# Patient Record
Sex: Male | Born: 1935 | Race: White | Hispanic: No | Marital: Married | Smoking: Former smoker
Health system: Southern US, Community
[De-identification: ages and names within clinical notes are randomized; demographics above are authoritative.]

## PROBLEM LIST (undated history)

## (undated) DIAGNOSIS — M1990 Unspecified osteoarthritis, unspecified site: Secondary | ICD-10-CM

## (undated) DIAGNOSIS — K219 Gastro-esophageal reflux disease without esophagitis: Secondary | ICD-10-CM

## (undated) DIAGNOSIS — C259 Malignant neoplasm of pancreas, unspecified: Secondary | ICD-10-CM

## (undated) DIAGNOSIS — N400 Enlarged prostate without lower urinary tract symptoms: Secondary | ICD-10-CM

## (undated) HISTORY — DX: Gastro-esophageal reflux disease without esophagitis: K21.9

## (undated) HISTORY — PX: HERNIA REPAIR: SHX51

## (undated) HISTORY — PX: TONSILLECTOMY: SUR1361

## (undated) HISTORY — DX: Malignant neoplasm of pancreas, unspecified: C25.9

---

## 2002-12-20 ENCOUNTER — Encounter: Payer: Self-pay | Admitting: Family Medicine

## 2002-12-20 ENCOUNTER — Ambulatory Visit (HOSPITAL_COMMUNITY): Admission: RE | Admit: 2002-12-20 | Discharge: 2002-12-20 | Payer: Self-pay | Admitting: Family Medicine

## 2002-12-31 ENCOUNTER — Encounter: Payer: Self-pay | Admitting: Family Medicine

## 2002-12-31 ENCOUNTER — Ambulatory Visit (HOSPITAL_COMMUNITY): Admission: RE | Admit: 2002-12-31 | Discharge: 2002-12-31 | Payer: Self-pay | Admitting: Family Medicine

## 2003-01-08 ENCOUNTER — Ambulatory Visit (HOSPITAL_COMMUNITY): Admission: RE | Admit: 2003-01-08 | Discharge: 2003-01-08 | Payer: Self-pay | Admitting: Pulmonary Disease

## 2008-04-25 ENCOUNTER — Ambulatory Visit (HOSPITAL_COMMUNITY): Admission: RE | Admit: 2008-04-25 | Discharge: 2008-04-25 | Payer: Self-pay | Admitting: Family Medicine

## 2010-08-07 NOTE — Procedures (Signed)
   NAME:  Jeremy Ray, Jeremy Ray                      ACCOUNT NO.:  [id number]   MEDICAL RECORD NO.:  [id number]                   PATIENT TYPE:  OUT   LOCATION:  RAD                                  FACILITY:  [facility]   PHYSICIAN:  Edward L. Juanetta Gosling, M.D.             DATE OF BIRTH:  05-02-1923   DATE OF PROCEDURE:  01/08/2003  DATE OF DISCHARGE:                                    STRESS TEST   INDICATIONS FOR PROCEDURE:  [name] is undergoing graded exercise  testing because of a history of slow heart rate.  He has had some episodes  of being faint when he stood up.  There are no contraindications to graded  exercise testing.   [name] exercised for six minutes on the Bruce protocol, reaching and  sustaining 7.0 METs.  His maximum recorded heart rate was 169.  His blood  pressure response to exercise was normal.  He had no chest discomfort during  exercise but almost immediately after exercise started he began having  abnormal heart rate with bursts of what appeared to be atrial fibrillation,  bursts of wandering atrial pacemaker, and multiple PACs.  He was  asymptomatic during these.  He also had some PVCs and had PVCs during  recovery as well.  As mentioned, he had no symptoms during this.  His  maximum sustained heart rate with this was about 169-170.  There were no  electrocardiographic changes suggestive of inducible ischemia and his blood  pressure response to exercise was normal.   IMPRESSION:  1. No evidence of inducible ischemia.  2. No symptoms with exercise.  3. Normal blood pressure response to exercise.  4. Atrial arrhythmias including atrial flutter/fibrillation and wandering     atrial pacemaker with exercise.      ___________________________________________                                            Oneal Deputy. Juanetta Gosling, M.D.   ELH/MEDQ  D:  01/08/2003  T:  01/08/2003  Job:  [id number]   cc:   Angus G. Renard Matter, M.D.  [address]  [place]  [place]  [address]  Fax: [phone]

## 2011-11-09 ENCOUNTER — Ambulatory Visit (INDEPENDENT_AMBULATORY_CARE_PROVIDER_SITE_OTHER): Payer: Medicare Other | Admitting: Urology

## 2011-11-09 DIAGNOSIS — N433 Hydrocele, unspecified: Secondary | ICD-10-CM

## 2011-11-09 DIAGNOSIS — K4090 Unilateral inguinal hernia, without obstruction or gangrene, not specified as recurrent: Secondary | ICD-10-CM

## 2011-11-09 DIAGNOSIS — N529 Male erectile dysfunction, unspecified: Secondary | ICD-10-CM

## 2011-11-09 DIAGNOSIS — N400 Enlarged prostate without lower urinary tract symptoms: Secondary | ICD-10-CM

## 2011-11-09 DIAGNOSIS — R972 Elevated prostate specific antigen [PSA]: Secondary | ICD-10-CM

## 2012-09-05 ENCOUNTER — Encounter: Payer: Self-pay | Admitting: *Deleted

## 2012-09-06 ENCOUNTER — Encounter: Payer: Self-pay | Admitting: Cardiovascular Disease

## 2012-09-06 ENCOUNTER — Ambulatory Visit (INDEPENDENT_AMBULATORY_CARE_PROVIDER_SITE_OTHER): Payer: Medicare Other | Admitting: Cardiovascular Disease

## 2012-09-06 VITALS — BP 125/65 | HR 69 | Ht 70.0 in | Wt 156.0 lb

## 2012-09-06 DIAGNOSIS — R9431 Abnormal electrocardiogram [ECG] [EKG]: Secondary | ICD-10-CM

## 2012-09-06 DIAGNOSIS — I10 Essential (primary) hypertension: Secondary | ICD-10-CM

## 2012-09-06 DIAGNOSIS — I491 Atrial premature depolarization: Secondary | ICD-10-CM

## 2012-09-06 NOTE — Assessment & Plan Note (Signed)
Asymptomatic no need for Rx

## 2012-09-06 NOTE — Progress Notes (Signed)
Patient ID: Jeremy Ray, male   DOB: [birth year], 79 y.o.   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 Referred for abnormal ECG  Reviewed ECG from Dr Renard Matter and today SR PAC poor R wave progression possible anterior MI  Patient is asymptomatic and active No chest pain, palpitations dyspnea or syncope No history of heart issues.  Some arthritis and GERD.  No other old ECG; sin epic Denies excess alcohol or stimulants or coffee.    ROS: Denies fever, malais, weight loss, blurry vision, decreased visual acuity, cough, sputum, SOB, hemoptysis, pleuritic pain, palpitaitons, heartburn, abdominal pain, melena, lower extremity edema, claudication, or rash.  All other systems reviewed and negative   General: Affect appropriate Healthy:  appears stated age HEENT: normal Neck supple with no adenopathy JVP normal no bruits no thyromegaly Lungs clear with no wheezing and good diaphragmatic motion Heart:  S1/S2 no murmur,rub, gallop or click PMI normal Abdomen: benighn, BS positve, no tenderness, no AAA no bruit.  No HSM or HJR Distal pulses intact with no bruits No edema Neuro non-focal Skin warm and dry No muscular weakness  Medications Current Outpatient Prescriptions  Medication Sig Dispense Refill  . pantoprazole (PROTONIX) 40 MG tablet 40 mg as needed.       . traMADol (ULTRAM) 50 MG tablet 50 mg every 6 (six) hours as needed.        No current facility-administered medications for this visit.    Allergies Review of patient's allergies indicates no known allergies.  Family History: No family history on file.  Social History: History   Social History  . Marital Status: Married    Spouse Name: N/A    Number of Children: N/A  . Years of Education: N/A   Occupational History  . Not on file.   Social History Main Topics  . Smoking status: Never Smoker   . Smokeless tobacco: Not on file  . Alcohol Use: No  . Drug Use: No  . Sexually Active: Not on file   Other  Topics Concern  . Not on file   Social History Narrative  . No narrative on file    Electrocardiogram:  SR rate 80 poor R wave progression  Assessment and Plan

## 2012-09-06 NOTE — Assessment & Plan Note (Signed)
Doubt significant cardiac issue.  Echo to assess RWMAls .

## 2012-09-06 NOTE — Patient Instructions (Addendum)
Your physician recommends that you schedule a follow-up appointment in:AS NEEDED  Your physician has requested that you have an echocardiogram. Echocardiography is a painless test that uses sound waves to create images of your heart. It provides your doctor with information about the size and shape of your heart and how well your heart's chambers and valves are working. This procedure takes approximately one hour. There are no restrictions for this procedure WE WILL CALL YOU WITH THE RESULTS

## 2012-09-07 ENCOUNTER — Ambulatory Visit (HOSPITAL_COMMUNITY)
Admission: RE | Admit: 2012-09-07 | Discharge: 2012-09-07 | Disposition: A | Discharge status: Home / Self Care | Payer: Medicare Other | Source: Ambulatory Visit | Attending: Cardiovascular Disease | Admitting: Cardiovascular Disease

## 2012-09-07 DIAGNOSIS — R9431 Abnormal electrocardiogram [ECG] [EKG]: Secondary | ICD-10-CM

## 2012-09-07 DIAGNOSIS — I10 Essential (primary) hypertension: Secondary | ICD-10-CM | POA: Insufficient documentation

## 2012-09-07 DIAGNOSIS — I517 Cardiomegaly: Secondary | ICD-10-CM

## 2012-09-07 NOTE — Progress Notes (Signed)
*  PRELIMINARY RESULTS* Echocardiogram 2D Echocardiogram has been performed.  Jeremy Ray 09/07/2012, 11:44 AM

## 2012-09-11 ENCOUNTER — Telehealth: Payer: Self-pay | Admitting: *Deleted

## 2012-09-11 NOTE — Telephone Encounter (Signed)
Results for echo as noted:  Normal echo with good EF and no significant valvular heart disease      Pt made aware, noted error in epic allowing this nurse to place notations in pt chart on the results note

## 2013-02-27 ENCOUNTER — Other Ambulatory Visit (HOSPITAL_COMMUNITY): Payer: Self-pay | Admitting: Family Medicine

## 2013-02-27 DIAGNOSIS — R634 Abnormal weight loss: Secondary | ICD-10-CM

## 2013-02-27 DIAGNOSIS — R17 Unspecified jaundice: Secondary | ICD-10-CM

## 2013-02-28 ENCOUNTER — Ambulatory Visit (HOSPITAL_COMMUNITY)
Admission: RE | Admit: 2013-02-28 | Discharge: 2013-02-28 | Disposition: A | Discharge status: Home / Self Care | Payer: Medicare Other | Source: Ambulatory Visit | Attending: Family Medicine | Admitting: Family Medicine

## 2013-02-28 ENCOUNTER — Encounter (INDEPENDENT_AMBULATORY_CARE_PROVIDER_SITE_OTHER): Payer: Self-pay | Admitting: Internal Medicine

## 2013-02-28 ENCOUNTER — Ambulatory Visit (INDEPENDENT_AMBULATORY_CARE_PROVIDER_SITE_OTHER): Payer: Medicare Other | Admitting: Internal Medicine

## 2013-02-28 VITALS — BP 112/68 | HR 76 | Temp 100.0°F | Resp 18 | Ht 69.0 in | Wt 151.7 lb

## 2013-02-28 DIAGNOSIS — K831 Obstruction of bile duct: Secondary | ICD-10-CM | POA: Insufficient documentation

## 2013-02-28 DIAGNOSIS — D649 Anemia, unspecified: Secondary | ICD-10-CM

## 2013-02-28 DIAGNOSIS — R188 Other ascites: Secondary | ICD-10-CM | POA: Insufficient documentation

## 2013-02-28 DIAGNOSIS — R17 Unspecified jaundice: Secondary | ICD-10-CM

## 2013-02-28 DIAGNOSIS — K838 Other specified diseases of biliary tract: Secondary | ICD-10-CM | POA: Insufficient documentation

## 2013-02-28 DIAGNOSIS — Q619 Cystic kidney disease, unspecified: Secondary | ICD-10-CM | POA: Insufficient documentation

## 2013-02-28 DIAGNOSIS — R599 Enlarged lymph nodes, unspecified: Secondary | ICD-10-CM | POA: Insufficient documentation

## 2013-02-28 DIAGNOSIS — R634 Abnormal weight loss: Secondary | ICD-10-CM

## 2013-02-28 DIAGNOSIS — K869 Disease of pancreas, unspecified: Secondary | ICD-10-CM | POA: Insufficient documentation

## 2013-02-28 DIAGNOSIS — R195 Other fecal abnormalities: Secondary | ICD-10-CM | POA: Insufficient documentation

## 2013-02-28 MED ORDER — IOHEXOL 300 MG/ML  SOLN
100.0000 mL | Freq: Once | INTRAMUSCULAR | Status: AC | PRN
  Administered 2013-02-28: 100 mL via INTRAVENOUS

## 2013-02-28 NOTE — Patient Instructions (Signed)
Procedures to be scheduled

## 2013-02-28 NOTE — Progress Notes (Signed)
Reason for consultation;  Obstructive jaundice.  History of present illness;  Patient is an 79 year old Caucasian male who is referred through courtesy of Dr. Renard Matter for GI evaluation. He was evaluated by Dr. Renard Matter yesterday and noted to be jaundice. Patient was apparently told by 2 family members last week that he was jaundiced. He has not felt well for the last few weeks. He has noted loss of appetite and sense of taste and has lost 10 pounds. He also has been having explosive diarrhea. About 3 weeks ago he passed moderate to large amount of dark or burgundy blood along with his bowel movement. He has not experienced nausea vomiting abdominal pain pruritus fever or chills. His urine has been dark for about two weeks but this morning was of normal color. He has been on pantoprazole for heartburn which he stopped a few days ago thinking that it may be the source of his symptoms. He does not take tramadol very often which he uses for left ankle arthritis. He has never undergone colonoscopy.  Current Medications: Current Outpatient Prescriptions  Medication Sig Dispense Refill  . pantoprazole (PROTONIX) 40 MG tablet 40 mg as needed.       . traMADol (ULTRAM) 50 MG tablet 50 mg every 6 (six) hours as needed.        No current facility-administered medications for this visit.   Past medical history; GERD was diagnosed a few months ago. Right inguinal herniorrhaphy about 15 years ago. History of dystonia involving shoulders since early childhood. Left hydrocele of over 10 years duration.    Allergies; NKA.  Family history; Mother died of natural causes few days before her 42 birthday. Father had CVA at age 71 and died at 65. He had 4 brothers and they are all diseased. One brother was killed during World War II. One brother died of pancreatic carcinoma at age 96 within 6 months of diagnosis. One brother died of 4 wheeler accident age 65 and fourth brother died of alcoholic cirrhosis  at age 85.   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 He has never smoked cigarettes or drank alcohol.            Physical examination; Blood pressure 112/68, pulse 76, temperature 100 F (37.8 C), temperature source Oral, resp. rate 18, height 5\' 9"  (1.753 m), weight 151 lb 11.2 oz (68.811 kg). Patient is alert and in no acute distress. He is jaundiced. Conjunctiva is pink. Sclera is icteric Oropharyngeal mucosa is normal. No neck masses or thyromegaly noted. Cardiac exam with regular rhythm normal S1 and S2. He has faint systolic ejection murmur best heard at aortic area. Lungs are clear to auscultation. Abdomen is flat and soft without tenderness or organomegaly. Liver edge is easily palpable below RCM and it is nontender. Rectal examination reveals brown but strongly guaiac positive stool. Examination of external genitalia reveal large left hydrocele.  No LE edema or clubbing noted.  Labs/studies Results: Lab data from 02/27/2013. WBC 7.0, H&H 10.3 and 30.6, MCV 84.1 and platelet count 399K. Serum sodium 1:30, potassium 5.1, chloride 92, CO2 28, glucose 201, BUN 14, creatinine 0.85 and calcium 9.1 Bilirubin 12.6 direct 9.5, AP 715, AST 81, ALT 73, total protein 6.0 and albumin 2.7. Abdominal pelvic CT reviewed. Markedly dilated biliary system. CBD measures 25 mm with cough in the region of pancreatic head with a mass measuring  2.2 x 1.8 x 2.1 cm. 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 Enlarged celiac axis and portacaval lymph nodes and small amount of ascites the    Assessment:  #1. Obstructive jaundice. Patient presents with painless jaundice anorexia and weight loss. Abdominal pelvic CT suggests mass in the region of head of pancreas. He also appears to have enlarged lymph nodes at celiac axis and portacaval region. His  presentation is very concerning for pancreatic carcinoma with CBD obstruction. He will need ERCP both for diagnostic and therapeutic purposes. He will need biliary stenting. He may also have developed exocrine pancreatic insufficiency. #2. Anemia. His stool is strongly guaiac positive today. He also gives history of passing dark or burgundy blood 3 weeks ago. Source could be from upper mid or lower GI tract. No abnormality noted to small or large bowel on CT.    Recommendations;  ERCP with biliary stenting. He will also undergo EGD prior to ERCP looking for source of GI blood loss. Will check INR and CA 19-9. Will initiate therapy with pancreatic enzyme supplement at the time of ERCP. I have reviewed both the procedures with patient and his wife and they're both agreeable. I also offered to talk with one of her children but they would like to wait for now.

## 2013-03-01 ENCOUNTER — Encounter (HOSPITAL_COMMUNITY)
Admission: RE | Admit: 2013-03-01 | Discharge: 2013-03-01 | Disposition: A | Discharge status: Home / Self Care | Payer: Medicare Other | Source: Ambulatory Visit | Attending: Internal Medicine | Admitting: Internal Medicine

## 2013-03-01 ENCOUNTER — Encounter (HOSPITAL_COMMUNITY): Payer: Self-pay | Admitting: Pharmacy Technician

## 2013-03-01 ENCOUNTER — Other Ambulatory Visit (INDEPENDENT_AMBULATORY_CARE_PROVIDER_SITE_OTHER): Payer: Self-pay | Admitting: *Deleted

## 2013-03-01 ENCOUNTER — Encounter (HOSPITAL_COMMUNITY): Payer: Self-pay

## 2013-03-01 DIAGNOSIS — K831 Obstruction of bile duct: Secondary | ICD-10-CM

## 2013-03-01 DIAGNOSIS — R195 Other fecal abnormalities: Secondary | ICD-10-CM

## 2013-03-01 DIAGNOSIS — D649 Anemia, unspecified: Secondary | ICD-10-CM

## 2013-03-01 HISTORY — DX: Unspecified osteoarthritis, unspecified site: M19.90

## 2013-03-02 ENCOUNTER — Encounter (HOSPITAL_COMMUNITY)
Admission: RE | Disposition: A | Discharge status: Home / Self Care | Payer: Self-pay | Source: Ambulatory Visit | Attending: Internal Medicine

## 2013-03-02 ENCOUNTER — Encounter (HOSPITAL_COMMUNITY): Payer: Self-pay | Admitting: *Deleted

## 2013-03-02 ENCOUNTER — Ambulatory Visit (HOSPITAL_COMMUNITY)
Admission: RE | Admit: 2013-03-02 | Discharge: 2013-03-02 | Disposition: A | Discharge status: Home / Self Care | Payer: Medicare Other | Source: Ambulatory Visit | Attending: Internal Medicine | Admitting: Internal Medicine

## 2013-03-02 ENCOUNTER — Encounter (HOSPITAL_COMMUNITY): Payer: Medicare Other | Admitting: Anesthesiology

## 2013-03-02 ENCOUNTER — Ambulatory Visit (HOSPITAL_COMMUNITY): Payer: Medicare Other

## 2013-03-02 ENCOUNTER — Ambulatory Visit (HOSPITAL_COMMUNITY): Payer: Medicare Other | Admitting: Anesthesiology

## 2013-03-02 DIAGNOSIS — R630 Anorexia: Secondary | ICD-10-CM | POA: Insufficient documentation

## 2013-03-02 DIAGNOSIS — K449 Diaphragmatic hernia without obstruction or gangrene: Secondary | ICD-10-CM | POA: Insufficient documentation

## 2013-03-02 DIAGNOSIS — R634 Abnormal weight loss: Secondary | ICD-10-CM | POA: Insufficient documentation

## 2013-03-02 DIAGNOSIS — K922 Gastrointestinal hemorrhage, unspecified: Secondary | ICD-10-CM

## 2013-03-02 DIAGNOSIS — K2210 Ulcer of esophagus without bleeding: Secondary | ICD-10-CM | POA: Insufficient documentation

## 2013-03-02 DIAGNOSIS — K838 Other specified diseases of biliary tract: Secondary | ICD-10-CM

## 2013-03-02 DIAGNOSIS — K831 Obstruction of bile duct: Secondary | ICD-10-CM

## 2013-03-02 DIAGNOSIS — K219 Gastro-esophageal reflux disease without esophagitis: Secondary | ICD-10-CM | POA: Insufficient documentation

## 2013-03-02 DIAGNOSIS — D649 Anemia, unspecified: Secondary | ICD-10-CM

## 2013-03-02 DIAGNOSIS — R7402 Elevation of levels of lactic acid dehydrogenase (LDH): Secondary | ICD-10-CM | POA: Insufficient documentation

## 2013-03-02 DIAGNOSIS — R7401 Elevation of levels of liver transaminase levels: Secondary | ICD-10-CM | POA: Insufficient documentation

## 2013-03-02 DIAGNOSIS — R195 Other fecal abnormalities: Secondary | ICD-10-CM

## 2013-03-02 DIAGNOSIS — R17 Unspecified jaundice: Secondary | ICD-10-CM | POA: Insufficient documentation

## 2013-03-02 DIAGNOSIS — Z9889 Other specified postprocedural states: Secondary | ICD-10-CM

## 2013-03-02 HISTORY — PX: SPHINCTEROTOMY: SHX5544

## 2013-03-02 HISTORY — PX: ESOPHAGOGASTRODUODENOSCOPY: SHX5428

## 2013-03-02 HISTORY — PX: ERCP: SHX5425

## 2013-03-02 HISTORY — PX: BILIARY STENT PLACEMENT: SHX5538

## 2013-03-02 LAB — POTASSIUM: Potassium: 5 mEq/L (ref 3.5–5.1)

## 2013-03-02 LAB — PROTIME-INR: INR: 1.08 (ref 0.00–1.49)

## 2013-03-02 SURGERY — ERCP, WITH INTERVENTION IF INDICATED
Anesthesia: General | Site: Mouth

## 2013-03-02 MED ORDER — NEOSTIGMINE METHYLSULFATE 1 MG/ML IJ SOLN
INTRAMUSCULAR | Status: DC | PRN
  Administered 2013-03-02: 4 mg via INTRAVENOUS

## 2013-03-02 MED ORDER — NEOSTIGMINE METHYLSULFATE 1 MG/ML IJ SOLN
INTRAMUSCULAR | Status: AC
  Filled 2013-03-02: qty 1

## 2013-03-02 MED ORDER — GLUCAGON HCL (RDNA) 1 MG IJ SOLR
INTRAMUSCULAR | Status: DC | PRN
  Administered 2013-03-02: 0.25 mg via INTRAVENOUS

## 2013-03-02 MED ORDER — GLYCOPYRROLATE 0.2 MG/ML IJ SOLN
INTRAMUSCULAR | Status: DC | PRN
  Administered 2013-03-02: 0.6 mg via INTRAVENOUS

## 2013-03-02 MED ORDER — CEFAZOLIN SODIUM-DEXTROSE 2-3 GM-% IV SOLR
2.0000 g | Freq: Once | INTRAVENOUS | Status: AC
  Administered 2013-03-02: 2 g via INTRAVENOUS

## 2013-03-02 MED ORDER — LACTATED RINGERS IV SOLN
INTRAVENOUS | Status: DC
  Administered 2013-03-02: 1000 mL via INTRAVENOUS

## 2013-03-02 MED ORDER — MIDAZOLAM HCL 2 MG/2ML IJ SOLN
1.0000 mg | INTRAMUSCULAR | Status: DC | PRN
  Administered 2013-03-02: 2 mg via INTRAVENOUS

## 2013-03-02 MED ORDER — GLUCAGON HCL (RDNA) 1 MG IJ SOLR
INTRAMUSCULAR | Status: AC
  Filled 2013-03-02: qty 2

## 2013-03-02 MED ORDER — SUCCINYLCHOLINE CHLORIDE 20 MG/ML IJ SOLN
INTRAMUSCULAR | Status: AC
  Filled 2013-03-02: qty 1

## 2013-03-02 MED ORDER — STERILE WATER FOR IRRIGATION IR SOLN
Status: DC | PRN
  Administered 2013-03-02: 15:00:00

## 2013-03-02 MED ORDER — FENTANYL CITRATE 0.05 MG/ML IJ SOLN
INTRAMUSCULAR | Status: DC | PRN
  Administered 2013-03-02 (×2): 50 ug via INTRAVENOUS

## 2013-03-02 MED ORDER — LIDOCAINE HCL (CARDIAC) 20 MG/ML IV SOLN
INTRAVENOUS | Status: DC | PRN
  Administered 2013-03-02: 50 mg via INTRAVENOUS

## 2013-03-02 MED ORDER — PHENYLEPHRINE HCL 10 MG/ML IJ SOLN
INTRAMUSCULAR | Status: AC
  Filled 2013-03-02: qty 1

## 2013-03-02 MED ORDER — ROCURONIUM BROMIDE 100 MG/10ML IV SOLN
INTRAVENOUS | Status: DC | PRN
  Administered 2013-03-02: 30 mg via INTRAVENOUS

## 2013-03-02 MED ORDER — CEFAZOLIN SODIUM-DEXTROSE 2-3 GM-% IV SOLR: 2.0000 g | INTRAVENOUS | Status: DC

## 2013-03-02 MED ORDER — EPHEDRINE SULFATE 50 MG/ML IJ SOLN
INTRAMUSCULAR | Status: DC | PRN
  Administered 2013-03-02 (×2): 10 mg via INTRAVENOUS

## 2013-03-02 MED ORDER — FENTANYL CITRATE 0.05 MG/ML IJ SOLN
25.0000 ug | INTRAMUSCULAR | Status: DC | PRN
  Administered 2013-03-02: 50 ug via INTRAVENOUS
  Filled 2013-03-02: qty 2

## 2013-03-02 MED ORDER — OMEPRAZOLE 20 MG PO CPDR: 20.0000 mg | DELAYED_RELEASE_CAPSULE | Freq: Two times a day (BID) | ORAL | Status: DC

## 2013-03-02 MED ORDER — EPHEDRINE SULFATE 50 MG/ML IJ SOLN
INTRAMUSCULAR | Status: AC
  Filled 2013-03-02: qty 1

## 2013-03-02 MED ORDER — MIDAZOLAM HCL 2 MG/2ML IJ SOLN
INTRAMUSCULAR | Status: AC
  Filled 2013-03-02: qty 2

## 2013-03-02 MED ORDER — ROCURONIUM BROMIDE 50 MG/5ML IV SOLN
INTRAVENOUS | Status: AC
  Filled 2013-03-02: qty 1

## 2013-03-02 MED ORDER — SODIUM CHLORIDE BACTERIOSTATIC 0.9 % IJ SOLN
INTRAMUSCULAR | Status: AC
  Filled 2013-03-02: qty 10

## 2013-03-02 MED ORDER — SODIUM CHLORIDE 0.9 % IV SOLN
INTRAVENOUS | Status: DC | PRN
  Administered 2013-03-02: 15:00:00

## 2013-03-02 MED ORDER — LIDOCAINE HCL (PF) 1 % IJ SOLN
INTRAMUSCULAR | Status: AC
  Filled 2013-03-02: qty 5

## 2013-03-02 MED ORDER — SODIUM CHLORIDE 0.9 % IV SOLN
INTRAVENOUS | Status: AC
  Filled 2013-03-02: qty 50

## 2013-03-02 MED ORDER — ONDANSETRON HCL 4 MG/2ML IJ SOLN: 4.0000 mg | Freq: Once | INTRAMUSCULAR | Status: DC | PRN

## 2013-03-02 MED ORDER — ETOMIDATE 2 MG/ML IV SOLN
INTRAVENOUS | Status: AC
  Filled 2013-03-02: qty 10

## 2013-03-02 MED ORDER — ETOMIDATE 2 MG/ML IV SOLN
INTRAVENOUS | Status: DC | PRN
  Administered 2013-03-02: 14 mg via INTRAVENOUS

## 2013-03-02 MED ORDER — LACTATED RINGERS IV SOLN
INTRAVENOUS | Status: DC | PRN
  Administered 2013-03-02 (×2): via INTRAVENOUS

## 2013-03-02 MED ORDER — SODIUM CHLORIDE BACTERIOSTATIC 0.9 % IJ SOLN
INTRAMUSCULAR | Status: AC
  Filled 2013-03-02: qty 20

## 2013-03-02 MED ORDER — PANCRELIPASE (LIP-PROT-AMYL) 24000-76000 UNITS PO CPEP: ORAL_CAPSULE | ORAL | Status: AC

## 2013-03-02 MED ORDER — CEFAZOLIN SODIUM-DEXTROSE 2-3 GM-% IV SOLR
INTRAVENOUS | Status: AC
  Filled 2013-03-02: qty 50

## 2013-03-02 MED ORDER — GLYCOPYRROLATE 0.2 MG/ML IJ SOLN
INTRAMUSCULAR | Status: AC
  Filled 2013-03-02: qty 3

## 2013-03-02 MED ORDER — FENTANYL CITRATE 0.05 MG/ML IJ SOLN
INTRAMUSCULAR | Status: AC
  Filled 2013-03-02: qty 2

## 2013-03-02 SURGICAL SUPPLY — 41 items
BAG HAMPER (MISCELLANEOUS) ×3 IMPLANT
BALLN RETRIEVAL 12X15 (BALLOONS) IMPLANT
BASKET TRAPEZOID 3X6 (MISCELLANEOUS) IMPLANT
BLOCK BITE 60FR ADLT L/F BLUE (MISCELLANEOUS) ×3 IMPLANT
BRUSH CYTOL RX .035 2.1X8X200 (MISCELLANEOUS) ×3 IMPLANT
DEVICE INFLATION ENCORE 26 (MISCELLANEOUS) IMPLANT
DEVICE LOCKING W-BIOPSY CAP (MISCELLANEOUS) ×3 IMPLANT
ELECT REM PT RETURN 9FT ADLT (ELECTROSURGICAL) ×3
ELECTRODE REM PT RTRN 9FT ADLT (ELECTROSURGICAL) ×2 IMPLANT
FLOOR PAD 36X40 (MISCELLANEOUS) ×3
FORCEP COLD BIOPSY (CUTTING FORCEPS) IMPLANT
FORCEP RJ3 GP 1.8X160 W-NEEDLE (CUTTING FORCEPS) IMPLANT
FORCEPS BIOP RAD 4 LRG CAP 4 (CUTTING FORCEPS) IMPLANT
GUIDEWIRE HYDRA JAGWIRE .35 (WIRE) IMPLANT
GUIDEWIRE JAG HINI 025X260CM (WIRE) IMPLANT
KIT CLEAN ENDO COMPLIANCE (KITS) ×3 IMPLANT
KIT ROOM TURNOVER APOR (KITS) ×3 IMPLANT
LUBRICANT JELLY 4.5OZ STERILE (MISCELLANEOUS) ×3 IMPLANT
MANIFOLD NEPTUNE WASTE (CANNULA) ×3 IMPLANT
NEEDLE HYPO 18GX1.5 BLUNT FILL (NEEDLE) ×3 IMPLANT
NEEDLE SCLEROTHERAPY 25GX240 (NEEDLE) IMPLANT
PAD ARMBOARD 7.5X6 YLW CONV (MISCELLANEOUS) ×6 IMPLANT
PAD FLOOR 36X40 (MISCELLANEOUS) ×2 IMPLANT
PATHFINDER 450CM 0.18 (STENTS) IMPLANT
POSITIONER HEAD 8X9X4 ADT (SOFTGOODS) IMPLANT
PROBE APC STR FIRE (PROBE) IMPLANT
PROBE INJECTION GOLD (MISCELLANEOUS)
PROBE INJECTION GOLD 7FR (MISCELLANEOUS) IMPLANT
SNARE ROTATE MED OVAL 20MM (MISCELLANEOUS) IMPLANT
SNARE SHORT THROW 13M SML OVAL (MISCELLANEOUS) ×3 IMPLANT
SPHINCTEROTOME AUTOTOME .25 (MISCELLANEOUS) ×6 IMPLANT
SPHINCTEROTOME HYDRATOME 44 (MISCELLANEOUS) ×3 IMPLANT
SPONGE GAUZE 4X4 12PLY (GAUZE/BANDAGES/DRESSINGS) ×3 IMPLANT
SYR 3ML LL SCALE MARK (SYRINGE) IMPLANT
SYR 50ML LL SCALE MARK (SYRINGE) ×6 IMPLANT
SYSTEM CONTINUOUS INJECTION (MISCELLANEOUS) ×3 IMPLANT
TUBING ENDO SMARTCAP PENTAX (MISCELLANEOUS) ×3 IMPLANT
TUBING IRRIGATION ENDOGATOR (MISCELLANEOUS) ×3 IMPLANT
WALLSTENT METAL COVERED 10X60 (STENTS) ×3 IMPLANT
WALLSTENT METAL COVERED 10X80 (STENTS) IMPLANT
WATER STERILE IRR 1000ML POUR (IV SOLUTION) ×6 IMPLANT

## 2013-03-02 NOTE — Anesthesia Preprocedure Evaluation (Signed)
Anesthesia Evaluation  Patient identified by MRN, date of birth, ID band Patient awake    Reviewed: Allergy & Precautions, H&P , NPO status , Patient's Chart, lab work & pertinent test results  Airway Mallampati: I TM Distance: >3 FB     Dental  (+) Edentulous Upper and Edentulous Lower   Pulmonary former smoker,  breath sounds clear to auscultation        Cardiovascular negative cardio ROS  Rhythm:Regular Rate:Normal     Neuro/Psych    GI/Hepatic GERD-  Controlled and Medicated,  Endo/Other    Renal/GU      Musculoskeletal   Abdominal   Peds  Hematology  (+) anemia ,   Anesthesia Other Findings   Reproductive/Obstetrics                           Anesthesia Physical Anesthesia Plan  ASA: II  Anesthesia Plan: General   Post-op Pain Management:    Induction: Intravenous, Rapid sequence and Cricoid pressure planned  Airway Management Planned: Oral ETT  Additional Equipment:   Intra-op Plan:   Post-operative Plan: Extubation in OR  Informed Consent: I have reviewed the patients History and Physical, chart, labs and discussed the procedure including the risks, benefits and alternatives for the proposed anesthesia with the patient or authorized representative who has indicated his/her understanding and acceptance.     Plan Discussed with:   Anesthesia Plan Comments:         Anesthesia Quick Evaluation

## 2013-03-02 NOTE — Preoperative (Signed)
Beta Blockers   Reason not to administer Beta Blockers:Not Applicable 

## 2013-03-02 NOTE — Brief Op Note (Signed)
03/02/2013  4:17 PM  PATIENT:  Jeremy Ray  79 y.o. male  PRE-OPERATIVE DIAGNOSIS:  obstructive jaundice, heme pos stool, anemia  POST-OPERATIVE DIAGNOSIS:  6cm linear esophagus ulcer,Hiatal hernia; Hiatus 39/36cm,Permanent wall stent placed in common bile duct  PROCEDURE:  Procedure(s) with comments: ENDOSCOPIC RETROGRADE CHOLANGIOPANCREATOGRAPHY (ERCP) (N/A) - 100 SPHINCTEROTOMY (N/A) BILIARY WALL STENT PLACEMENT (N/A) ESOPHAGOGASTRODUODENOSCOPY (EGD) (N/A)  SURGEON:  Surgeon(s) and Role:    * Malissa Hippo, MD - Primary    Findings; Ulcerative reflux esophagitis. Small sliding hilum hernia. High-grade distal CBD stricture suspected pancreatic carcinoma. Short sphincterotomy performed. Brushing cytology done. 10 mm x 60 mm fully covered wall stent placed for biliary decompression

## 2013-03-02 NOTE — H&P (Signed)
Jeremy Ray is an 79 y.o. male.   Chief Complaint: Patient is here for EGD and ERCP. HPI: Patient is a 79 year old Caucasian male who was seen in the office 2 days ago for painless jaundice. He also has anorexia and weight loss. His bilirubin was over 12 mg and AP over 700. He has mildly elevated transaminases and he is also anemic. CT shows dilated biliary system and mass in head of pancreas. He also gives history of passing burgundy dark blood per rectum 3 weeks ago and he was noted to be strongly heme positive the office. His hemoglobin is 10.3. He is here for further evaluation.  Past Medical History  Diagnosis Date  . GERD (gastroesophageal reflux disease)   . Arthritis     Past Surgical History  Procedure Laterality Date  . Tonsillectomy    . Hernia repair Right     Patient states that this was 50 years ago    Family History  Problem Relation Age of Onset  . Stroke Father   . Pancreatic cancer Brother   . Cirrhosis Brother   . Healthy Son   . Healthy Daughter    Social History:  reports that he quit smoking about 50 years ago. His smoking use included Cigars. He quit smokeless tobacco use about 40 years ago. His smokeless tobacco use included Chew. He reports that he does not drink alcohol or use illicit drugs.  Allergies: No Known Allergies  Medications Prior to Admission  Medication Sig Dispense Refill  . pantoprazole (PROTONIX) 40 MG tablet 40 mg as needed.       . traMADol (ULTRAM) 50 MG tablet 50 mg every 6 (six) hours as needed.         Results for orders placed during the hospital encounter of 03/02/13 (from the past 48 hour(s))  PROTIME-INR     Status: None   Collection Time    03/02/13 12:24 PM      Result Value Range   Prothrombin Time 13.8  11.6 - 15.2 seconds   INR 1.08  0.00 - 1.49  POTASSIUM     Status: None   Collection Time    03/02/13 12:25 PM      Result Value Range   Potassium 5.0  3.5 - 5.1 mEq/L   No results found.  ROS  Blood  pressure 92/43, pulse 72, temperature 97.8 F (36.6 C), temperature source Oral, resp. rate 20, height 5\' 9"  (1.753 m), weight 151 lb (68.493 kg), SpO2 100.00%. Physical Exam  Constitutional:  Well-developed thin Caucasian male in NAD  HENT:  Mouth/Throat: Oropharynx is clear and moist.  Eyes: Conjunctivae are normal. Scleral icterus is present.  Neck: No thyromegaly present.  Cardiovascular: Normal rate and regular rhythm.   Murmur: faint SEM at LLSB  Respiratory: Effort normal and breath sounds normal.  GI: Soft. He exhibits no distension and no mass. There is no tenderness.  Musculoskeletal: He exhibits no edema.  Lymphadenopathy:    He has no cervical adenopathy.  Neurological: He is alert.  Skin: Skin is warm and dry.     Assessment/Plan Anemia, history of GI bleed and heme positive stool. Obstructive jaundice secondary to mass in head of pancreas suspicious for pancreatic carcinoma. EGD followed by ERCP with biliary stenting. I have reviewed the procedure and risks with the patient his wife and also talked with their daughter who came from [place] today. They're all agreeable to putting in the Wallstent if underlying etiology is felt to be malignant  neoplasm. CA 19-9 is pending.  Liridona Mashaw U 03/02/2013, 2:23 PM

## 2013-03-02 NOTE — Anesthesia Procedure Notes (Signed)
Procedure Name: Intubation Date/Time: 03/02/2013 2:58 PM Performed by: Pernell Dupre, Chadd Tollison A Pre-anesthesia Checklist: Patient identified, Patient being monitored, Timeout performed, Emergency Drugs available and Suction available Patient Re-evaluated:Patient Re-evaluated prior to inductionOxygen Delivery Method: Circle System Utilized Preoxygenation: Pre-oxygenation with 100% oxygen Intubation Type: IV induction and Cricoid Pressure applied Ventilation: Mask ventilation without difficulty Laryngoscope Size: 3 and Miller Grade View: Grade I Tube type: Oral Tube size: 7.0 mm Number of attempts: 1 Airway Equipment and Method: stylet Placement Confirmation: ETT inserted through vocal cords under direct vision,  positive ETCO2 and breath sounds checked- equal and bilateral Secured at: 21 cm Tube secured with: Tape Dental Injury: Teeth and Oropharynx as per pre-operative assessment

## 2013-03-02 NOTE — Anesthesia Postprocedure Evaluation (Signed)
  Anesthesia Post-op Note  Patient: Jeremy Ray  Procedure(s) Performed: Procedure(s) with comments: ENDOSCOPIC RETROGRADE CHOLANGIOPANCREATOGRAPHY (ERCP) (N/A) - 100 SPHINCTEROTOMY (N/A) BILIARY STENT PLACEMENT (N/A) ESOPHAGOGASTRODUODENOSCOPY (EGD) (N/A)  Patient Location: PACU  Anesthesia Type:General  Level of Consciousness: sedated and patient cooperative  Airway and Oxygen Therapy: Patient Spontanous Breathing and Patient connected to face mask oxygen  Post-op Pain: none  Post-op Assessment: Post-op Vital signs reviewed, Patient's Cardiovascular Status Stable, Respiratory Function Stable, Patent Airway, No signs of Nausea or vomiting and Pain level controlled  Post-op Vital Signs: Reviewed and stable  Complications: No apparent anesthesia complications

## 2013-03-02 NOTE — Transfer of Care (Signed)
Immediate Anesthesia Transfer of Care Note  Patient: Jeremy Ray  Procedure(s) Performed: Procedure(s) with comments: ENDOSCOPIC RETROGRADE CHOLANGIOPANCREATOGRAPHY (ERCP) (N/A) - 100 SPHINCTEROTOMY (N/A) BILIARY STENT PLACEMENT (N/A) ESOPHAGOGASTRODUODENOSCOPY (EGD) (N/A)  Patient Location: PACU  Anesthesia Type:General  Level of Consciousness: sedated and patient cooperative  Airway & Oxygen Therapy: Patient Spontanous Breathing and Patient connected to face mask oxygen  Post-op Assessment: Report given to PACU RN and Post -op Vital signs reviewed and stable  Post vital signs: Reviewed and stable  Complications: No apparent anesthesia complications

## 2013-03-03 NOTE — Op Note (Signed)
NAME:  Jeremy Ray, Jeremy Ray            ACCOUNT NO.:  [id number]  MEDICAL RECORD NO.:  [id number]  LOCATION:  [facility]                          FACILITY:  [facility]  PHYSICIAN:  Lionel December, M.D.    DATE OF BIRTH:  [birth year]  DATE OF PROCEDURE: DATE OF DISCHARGE:  03/02/2013                              OPERATIVE REPORT   PROCEDURE: 1. Esophagogastroduodenoscopy. 2. ERCP with biliary stenting.  INDICATION:  The patient is an 79 year old, Caucasian male, who presents with painless jaundice, anorexia and weight loss.  He was also found to be anemic and gives history of passing dark blood 3 weeks ago and he has strongly guaiac positive stool.  Procedure risks were reviewed with the patient and his wife.  Informed consent was obtained.  MEDS FOR SEDATION/ANESTHESIA:  The patient was given general endotracheal anesthesia.  Please see anesthesia records for dates and details.  FINDINGS:  Procedure performed in the OR.  Once the patient was placed under anesthesia and intubated, he was positioned in semiprone position and time-out was carried out.  1. Esophagogastroduodenoscopy.  Pentax videoscope was passed through oropharynx without any difficulty into esophagus.  Esophagus, mucosa of the proximal half was normal.  Distal 7-8 cm revealed long linear ulcers collapsing just proximal to GE junction.  GE junction was located at 36 cm and hiatus was 39.  Stomach, it was empty and distended very well by insufflation.  Folds in the proximal stomach are normal.  Examination of mucosa, body, antrum, stomach was empty and distended very well with insufflation.  Folds in the proximal stomach are normal.  Examination of mucosa at body, antrum, pyloric channel, fundus and cardia was normal.  Duodenum, bulbar mucosa was normal.  Scope was passed into second part of the duodenum.  Mucosa and folds were normal.  Endoscope was withdrawn and the patient was prepared for procedure #2.  1.  ERCP.  Pentax video duodenum scope was passed through oropharynx without any difficulty into esophagus across the stomach and pylorus into the descending duodenum.  Ampulla of Vater was identified, appeared to be normal.  The CBD was selectively cannulated without any difficulty with RX 44 Autotome and .035 Hydra Jagwire.  Dilute contrast was injected. High-grade stricture noted at distal CBD with cut off.  CBD and CHD and intrahepatic biliary radicals were markedly dilated.  Maximal diameter was estimated to be at least 30 mm.  No filling defects were noted.  The short biliary sphincterotomy was performed.  The sphincterotome was exchanged with a cytology brush.  Brushing cytology is obtained from the stricture.  I proceeded with placement of covered Wallstent given the nature of the disease.  Fully covered biliary wall flex stent 10 mm x 60 mm was used. The delivery system was advanced over the guidewire.  Once was introducing the bile duct, it was gradually deployed.  It was felt to be in good position.  As the stent was fully deployed, there were four diamonds outside the ampulla.  There was gush of dark brown bile into the duodenum.  Endoscope was withdrawn.  The patient tolerated the procedure well.  Stent reference number is [id number] and lot #[id number].  The patient tolerated the procedures well.  He was extubated and taken to PACU for recovery.  FINAL DIAGNOSES: 1. Extensive ulceration to the distal half of the esophagus, felt to     be secondary to gastroesophageal reflux disease. 2. Small sliding hiatal hernia. 3. High-grade stricture involving distal CBD with marked dilation     upstream.  This stricture was brushed for cytology. 4. Short biliary sphincterotomy performed and fully covered 10 mm x 60     mm wall flex biliary stent was placed for decompression.  RECOMMENDATIONS: 1. Omeprazole 20 mg p.o. b.i.d. 2. Creon 24, two capsules with each meal and 1 with snack.  I  will be contacting the patient and his wife with results of the cytology and blood work next week.          ______________________________ Lionel December, M.D.     NR/MEDQ  D:  03/02/2013  T:  03/03/2013  Job:  [id number]  cc:   Dr. Renard Matter

## 2013-03-05 ENCOUNTER — Encounter (HOSPITAL_COMMUNITY): Payer: Self-pay | Admitting: Internal Medicine

## 2013-03-12 ENCOUNTER — Telehealth (INDEPENDENT_AMBULATORY_CARE_PROVIDER_SITE_OTHER): Payer: Self-pay | Admitting: *Deleted

## 2013-03-12 ENCOUNTER — Other Ambulatory Visit (INDEPENDENT_AMBULATORY_CARE_PROVIDER_SITE_OTHER): Payer: Self-pay | Admitting: Internal Medicine

## 2013-03-12 NOTE — Telephone Encounter (Signed)
Forwarded to Dr.Rehman to address and the patient will be contacted after it is addressed.

## 2013-03-12 NOTE — Telephone Encounter (Addendum)
Dinari is going to be in a doughnut whole starting in Jan. His current rx for Pancrelipase is for 240 capsules which equals out to 8 daily. Lenell would like to know if Dr. Karilyn Cota would send in a new Rx for 12 capsules daily so he can get this filled before 03/22/13. The medications is going to cost him $1,300 starting in Jan. The return phone number is [phone] or [phone].  Marland Kitchen

## 2013-03-12 NOTE — Telephone Encounter (Signed)
If he is taking 8 capsules a day I cannot change to 12 capsules a day; Please call the patient. We will try to get some samples for him

## 2013-03-20 NOTE — Telephone Encounter (Signed)
Patient was called and made aware of Dr.Rehman's recommendation. I told patient that we had Creon 24,000 units , and that Delrae Rend has stated that this could be given to him . He will take as he was directed. Patient will come and pick up samples.

## 2013-03-26 ENCOUNTER — Other Ambulatory Visit (HOSPITAL_COMMUNITY): Payer: Self-pay | Admitting: Radiology

## 2013-03-26 ENCOUNTER — Encounter (HOSPITAL_COMMUNITY): Payer: Medicare Other | Attending: Hematology and Oncology

## 2013-03-26 ENCOUNTER — Encounter (HOSPITAL_COMMUNITY): Payer: Self-pay

## 2013-03-26 VITALS — BP 116/67 | HR 63 | Temp 97.9°F | Resp 20 | Wt 154.0 lb

## 2013-03-26 DIAGNOSIS — K869 Disease of pancreas, unspecified: Secondary | ICD-10-CM

## 2013-03-26 DIAGNOSIS — C250 Malignant neoplasm of head of pancreas: Secondary | ICD-10-CM | POA: Insufficient documentation

## 2013-03-26 DIAGNOSIS — K831 Obstruction of bile duct: Secondary | ICD-10-CM

## 2013-03-26 DIAGNOSIS — C259 Malignant neoplasm of pancreas, unspecified: Secondary | ICD-10-CM | POA: Insufficient documentation

## 2013-03-26 DIAGNOSIS — K838 Other specified diseases of biliary tract: Secondary | ICD-10-CM

## 2013-03-26 DIAGNOSIS — D500 Iron deficiency anemia secondary to blood loss (chronic): Secondary | ICD-10-CM

## 2013-03-26 DIAGNOSIS — K219 Gastro-esophageal reflux disease without esophagitis: Secondary | ICD-10-CM

## 2013-03-26 LAB — CBC WITH DIFFERENTIAL/PLATELET
Basophils Absolute: 0.1 10*3/uL (ref 0.0–0.1)
Basophils Relative: 2 % — ABNORMAL HIGH (ref 0–1)
EOS PCT: 6 % — AB (ref 0–5)
Eosinophils Absolute: 0.3 10*3/uL (ref 0.0–0.7)
HCT: 33.7 % — ABNORMAL LOW (ref 39.0–52.0)
HEMOGLOBIN: 11.3 g/dL — AB (ref 13.0–17.0)
LYMPHS PCT: 22 % (ref 12–46)
Lymphs Abs: 1.3 10*3/uL (ref 0.7–4.0)
MCH: 29.1 pg (ref 26.0–34.0)
MCHC: 33.5 g/dL (ref 30.0–36.0)
MCV: 86.9 fL (ref 78.0–100.0)
MONO ABS: 0.5 10*3/uL (ref 0.1–1.0)
MONOS PCT: 8 % (ref 3–12)
Neutro Abs: 3.8 10*3/uL (ref 1.7–7.7)
Neutrophils Relative %: 63 % (ref 43–77)
Platelets: 261 10*3/uL (ref 150–400)
RBC: 3.88 MIL/uL — ABNORMAL LOW (ref 4.22–5.81)
RDW: 15.8 % — AB (ref 11.5–15.5)
WBC: 6 10*3/uL (ref 4.0–10.5)

## 2013-03-26 LAB — COMPREHENSIVE METABOLIC PANEL
ALT: 28 U/L (ref 0–53)
AST: 31 U/L (ref 0–37)
Albumin: 3 g/dL — ABNORMAL LOW (ref 3.5–5.2)
Alkaline Phosphatase: 245 U/L — ABNORMAL HIGH (ref 39–117)
BUN: 20 mg/dL (ref 6–23)
CALCIUM: 9.2 mg/dL (ref 8.4–10.5)
CO2: 28 mEq/L (ref 19–32)
CREATININE: 1.04 mg/dL (ref 0.50–1.35)
Chloride: 97 mEq/L (ref 96–112)
GFR, EST AFRICAN AMERICAN: 72 mL/min — AB (ref >90)
GFR, EST NON AFRICAN AMERICAN: 62 mL/min — AB (ref >90)
Glucose, Bld: 200 mg/dL — ABNORMAL HIGH (ref 70–99)
Potassium: 4.5 mEq/L (ref 3.7–5.3)
Sodium: 136 mEq/L — ABNORMAL LOW (ref 137–147)
Total Bilirubin: 2.6 mg/dL — ABNORMAL HIGH (ref 0.3–1.2)
Total Protein: 6.7 g/dL (ref 6.0–8.3)

## 2013-03-26 NOTE — Progress Notes (Signed)
[place] A. Barnet Glasgow, M.D.  NEW PATIENT EVALUATION   Name: Jeremy Ray Date: 03/26/2013 MRN: [id number] DOB: 12-26-1923  PCP: Lanette Hampshire, MD   REFERRING PHYSICIAN: Lanette Hampshire, MD  REASON FOR REFERRAL: Obstructive jaundice, status post ERCP and stenting, tumor at the head of the pancreas highly suspicious for primary pancreatic carcinoma.     HISTORY OF PRESENT ILLNESS:Jeremy Ray is a 80 y.o. male who is referred for evaluation of carcinoma of the pancreas presenting with obstructive jaundice, status post ERCP and stenting, brushings negative at the time of sphincterotomy. Patient presented with painless jaundice and weight loss. He denies any nausea, vomiting, diarrhea, or constipation. Stools were dark at times but never with red blood. He denies any urinary hesitancy, PND, orthopnea, palpitations, incontinence, lower extremity swelling or redness, or any episodes of phlebitis. He also denies any cough, wheezing, skin rash, joint pain, headache, or seizures. He does have chronic back pain and uses 18.   PAST MEDICAL HISTORY:  has a past medical history of GERD (gastroesophageal reflux disease) and Arthritis.     PAST SURGICAL HISTORY: Past Surgical History  Procedure Laterality Date  . Tonsillectomy    . Hernia repair Right     Patient states that this was 50 years ago  . Ercp N/A 03/02/2013    Procedure: ENDOSCOPIC RETROGRADE CHOLANGIOPANCREATOGRAPHY (ERCP), BRUSHINGS OF BILE DUCT;  Surgeon: Rogene Houston, MD;  Location: [facility];  Service: Endoscopy;  Laterality: N/A;  . Sphincterotomy N/A 03/02/2013    Procedure: SPHINCTEROTOMY;  Surgeon: Rogene Houston, MD;  Location: [facility];  Service: Endoscopy;  Laterality: N/A;  . Biliary stent placement N/A 03/02/2013    Procedure: BILIARY WALL STENT PLACEMENT;  Surgeon: Rogene Houston, MD;  Location: [facility];  Service: Endoscopy;  Laterality: N/A;  .  Esophagogastroduodenoscopy N/A 03/02/2013    Procedure: ESOPHAGOGASTRODUODENOSCOPY (EGD);  Surgeon: Rogene Houston, MD;  Location: [facility];  Service: Endoscopy;  Laterality: N/A;     CURRENT MEDICATIONS: has a current medication list which includes the following prescription(s): omeprazole, pancrelipase (lip-prot-amyl), and tramadol.   ALLERGIES: Review of patient's allergies indicates not on file.   SOCIAL HISTORY:  reports that he quit smoking about 50 years ago. His smoking use included Cigars. He quit smokeless tobacco use about 40 years ago. His smokeless tobacco use included Chew. He reports that he does not drink alcohol or use illicit drugs.   FAMILY HISTORY: family history includes Cirrhosis in his brother; Healthy in his daughter and son; Pancreatic cancer in his brother; Stroke in his father.    REVIEW OF SYSTEMS:  Other than that discussed above is noncontributory.    PHYSICAL EXAM:  vitals were not taken for this visit.   GENERAL:alert, no distress and comfortable SKIN: skin color, texture, turgor are normal, no rashes or significant lesions EYES: normal, Conjunctiva are pink and non-injected, minimally icteric. OROPHARYNX:no exudate, no erythema and lips, buccal mucosa, and tongue normal  NECK: supple, thyroid normal size, non-tender, without nodularity CHEST: Normal AP diameter with no gynecomastia. LYMPH:  no palpable lymphadenopathy in the cervical, axillary or inguinal LUNGS: clear to auscultation and percussion with normal breathing effort HEART: regular rate & rhythm and no murmurs ABDOMEN:abdomen soft, non-tender and normal bowel sounds. No masses felt. No free fluid wave or shifting dullness. MUSCULOSKELETALl:no cyanosis of digits, no clubbing or edema  NEURO: alert & oriented x 3 with fluent speech,  no focal motor/sensory deficits    LABORATORY DATA:  Admission on 03/02/2013, Discharged on 03/02/2013  Component Date Value Range Status  . CA 19-9  03/02/2013 17709.8* <35.0 U/mL Final   Comment: (NOTE)                          Result repeated and verified.                          Result confirmed by automatic dilution.                          Performed at [facility]  . Prothrombin Time 03/02/2013 13.8  11.6 - 15.2 seconds Final  . INR 03/02/2013 1.08  0.00 - 1.49 Final  . Potassium 03/02/2013 5.0  3.5 - 5.1 mEq/L Final    Urinalysis No results found for this basename: colorurine, appearanceur, labspec, phurine, glucoseu, hgbur, bilirubinur, ketonesur, proteinur, urobilinogen, nitrite, leukocytesur      @RADIOGRAPHY : Ct Abdomen W Contrast  02/28/2013   CLINICAL DATA:  Jaundice, weight loss  EXAM: CT ABDOMEN WITH CONTRAST  TECHNIQUE: Multidetector CT imaging of the abdomen was performed using the standard protocol following bolus administration of intravenous contrast. Sagittal and coronal MPR images reconstructed from axial data set.  CONTRAST:  198mL OMNIPAQUE IOHEXOL 300 MG/ML SOLN. Dilute oral contrast.  COMPARISON:  None  FINDINGS: Minimal scarring at right lung base.  Paraesophageal herniation of a small amount of fat into inferior mediastinum.  Esophageal and paraesophageal varices noted.  Distended gallbladder.  Significant intrahepatic and proximal extrahepatic biliary dilatation due to high-grade distal CBD obstruction at the pancreatic head  CBD measures up to 25 mm diameter.  Abnormal soft tissue identified at the distal CBD and pancreatic head/body compatible with tumor of either pancreatic or biliary origin measuring 2.2 x 1.8 x 2.1 cm.  Mildly enlarged portacaval lymph node 13 mm short axis.  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  Distal portal vein and proximal intrahepatic portal radicles appear patent.  Splenic vein and SMV patent.  Infiltration of tissue planes in the celiac axis beginning at the celiac artery bifurcation.  Small amount of ascites  adjacent to liver.  No definite discrete peritoneal implant is seen though the pelvis was not imaged.  Mildly enlarged portacaval lymph node 13 mm short axis image 23.  Additional upper normal size peripancreatic nodes.  Indistinct tissue planes between tumor at pancreatic head and duodenum.  Gastric antrum incompletely distended.  Small cyst within left kidney 13 mm greatest diameter image 25.  Atrophic tail of pancreas secondary to mass at head/body.  Kidneys, adrenal glands and spleen otherwise unremarkable.  Visualize large and small bowel loops normal appearance.  Scattered atherosclerotic calcification aorta.  No acute osseous findings; scattered degenerative disc disease changes noted.  IMPRESSION: Marked biliary dilatation secondary to abnormal soft tissue at the pancreatic head/body consistent with a tumor of either pancreatic or biliary origin.  CBD 25 mm diameter.  Infiltration of celiac axis with enlarged portacaval lymph node and ascites.  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   Electronically Signed   By: Lavonia Dana M.D.   On: 02/28/2013 15:00   Dg Ercp With Sphincterotomy  03/02/2013   CLINICAL DATA:  Jaundice, biliary obstruction/dilatation by CT  EXAM: ERCP with sphincterotomy  TECHNIQUE: Multiple spot images obtained with  the fluoroscopic device and submitted for interpretation post-procedure.  COMPARISON:  CT abdomen and pelvis 02/28/2013  FINDINGS: Marked dilatation of the proximal CBD estimated at 29 mm based on a 14 mm endoscope.  Small amount of mucus/debris within dilated CBD without discrete filling defect/stone.  High-grade obstruction of the mid to distal CBD with abrupt termination of the contrast column consistent with a high-grade stricture.  Very short segment of distal CBD is patent and normal caliber.  Dilated intrahepatic biliary radicles.  Wall stent was placed across the stricture, with a prominent waist noted in the stent at the level of the stricture.   Last image demonstrates drainage of contrast from the dilated biliary system.  Dilated gallbladder identified on the preceding CT exam was not opacified during the study.  IMPRESSION: High-grade stricture of the distal CBD with marked dilatation of the proximal CBD to approximately 29 mm diameter and abrupt termination of the contrast column at the distal CBD, corresponding to the site of tumor identified on prior CT exam.  Wall stent placement with decompression of proximal dilatation.  These images were submitted for radiologic interpretation only. Please see the procedural report for the amount of contrast and the fluoroscopy time utilized.   Electronically Signed   By: Lavonia Dana M.D.   On: 03/02/2013 16:54   Dg Abd Portable 1v  03/02/2013   CLINICAL DATA:  Status post CRC.  EXAM: PORTABLE ABDOMEN - 1 VIEW  COMPARISON:  None.  FINDINGS: The upper abdomen is excluded from the field of view. There is a partially visualized biliary stent present. There is contrast in the colon from recent CT of the abdomen. There is no bowel dilatation to suggest obstruction. There is no evidence of pneumoperitoneum, portal venous gas or pneumatosis. There are no pathologic calcifications along the expected course of the ureters.The osseous structures are unremarkable.  IMPRESSION: Unremarkable KUB.  Biliary stent present.   Electronically Signed   By: Kathreen Devoid   On: 03/02/2013 16:31    PATHOLOGY: FINAL for MAKSYMILIAN, STEIMLE [id number]) Patient: Daphene Jaeger Collected: 03/05/2013 Client: [facility] Accession: [id number] Received: 03/05/2013 Hildred Laser DOB: 1923/06/08 Age: 64 Gender: M Reported: 03/06/2013 [address] Patient Ph: [phone] MRN#: [id number] Linna Hoff [place] [address] Client Acc#: Chart: Phone: [phone] Fax: LMP: Visit#: [id number] CC: CYTOPATHOLOGY REPORT Adequacy Reason Satisfactory For Evaluation. Diagnosis BILE DUCT BRUSHING(SPECIMEN 1 OF 1 COLLECTED 03/05/13): NO  MALIGNANT CELLS IDENTIFIED. [place]i RUND DO Pathologist, Electronic Signature (Case signed 03/06/2013) Specimen Clinical Information obstructive jaundice, heme positive stool, anemia, 6 cm linear esophagus ulcer, hiatus 39/36 cm Source Bile Duct Brushing, (specimen 1 of 1 collected 03/02/13) Gross Specimen: Received is/are 0.1cc's of slight cloudy fluid w/ brush. (BS:bs) Prepared: # Smears: 0 # Concentration Technique Slides (i.e. ThinPrep): 1 # Cell Block: Cell block attempted, not obtained Additional Studies: n/a Report signed out from the following location(s) Technical Component and Interpretation performed at [email or web address], [place], [place]   IMPRESSION:  #1. Mass at the head of the pancreas with elevated CA 19-9 with obstructive jaundice, status post ERCP, sphincterotomy, and stent placement, improved symptomatology with improved appetite. Attempt at tissue diagnosis from ERCP brushings was unsuccessful. #2. Gastroesophageal reflux disease, on treatment.   PLAN:  #1. A discussion was held with the patient, his wife, his daughter, and his son-in-law. Tissue diagnosis is critical if any additional therapy is to be rendered. He will discuss it with the rest of his family and make  a decision within the next 24-48 hours. #2. Because of involvement around the celiac axis, surgery is not an option at this time. Chemotherapy and radiotherapy combined may produce more side effects than benefits. #3. Recommendation is to utilize Gemzar plus Abraxane given on days 1, 8, and 15 every 28 days for 2 cycles and then repeat the CAT scan to assess response. An alternative regimen would be Gemzar, Taxotere, Xeloda (GTX) popular eyes at [facility] in [place] by Dr. Marya Landry should the previously mentioned combination not work or be poorly tolerated. #4. Arrangements will be made for interventional radiology to insert a LifePort as well. #5. Anticipated  life expectancy without treatment as less than 6 months. This was shared with the family and the patient. With treatment, no one knows.  I appreciate the opportunity of sharing in his care.   Doroteo Bradford, MD 03/26/2013 3:21 PM

## 2013-03-26 NOTE — Patient Instructions (Addendum)
.  [facility] Discharge Instructions  RECOMMENDATIONS MADE BY THE CONSULTANT AND ANY TEST RESULTS WILL BE SENT TO YOUR REFERRING PHYSICIAN.  EXAM FINDINGS BY THE PHYSICIAN TODAY AND SIGNS OR SYMPTOMS TO REPORT TO CLINIC OR PRIMARY PHYSICIAN: Exam and findings as discussed by Dr. Barnet Glasgow. Let us know your decision. We will then set you up for nurse navigator for education. We will also need a port placed if you are going to do chemotherapy  INSTRUCTIONS/FOLLOW-UP: Labs today  Thank you for choosing [facility] to provide your oncology and hematology care.  To afford each patient quality time with our providers, please arrive at least 15 minutes before your scheduled appointment time.  With your help, our goal is to use those 15 minutes to complete the necessary work-up to ensure our physicians have the information they need to help with your evaluation and healthcare recommendations.    Effective January 1st, 2014, we ask that you re-schedule your appointment with our physicians should you arrive 10 or more minutes late for your appointment.  We strive to give you quality time with our providers, and arriving late affects you and other patients whose appointments are after yours.    Again, thank you for choosing [facility]  Our hope is that these requests will decrease the amount of time that you wait before being seen by our physicians.       _____________________________________________________________  Should you have questions after your visit to [facility], please contact our office at [phone] between the hours of 8:30 a.m. and 5:00 p.m.  Voicemails left after 4:30 p.m. will not be returned until the following business day.  For prescription refill requests, have your pharmacy contact our office with your prescription refill request.

## 2013-03-26 NOTE — Progress Notes (Signed)
Jeremy Ray presented for labwork. Labs per MD order drawn via Peripheral Line 25 gauge needle inserted in lt ac.  Good blood return present. Procedure without incident.  Needle removed intact. Patient tolerated procedure well.

## 2013-03-27 ENCOUNTER — Other Ambulatory Visit (HOSPITAL_COMMUNITY): Payer: Self-pay | Admitting: Hematology and Oncology

## 2013-03-27 ENCOUNTER — Encounter (HOSPITAL_COMMUNITY): Payer: Self-pay | Admitting: Pharmacy Technician

## 2013-03-27 LAB — BETA 2 MICROGLOBULIN, SERUM: BETA 2 MICROGLOBULIN: 3.19 mg/L — AB (ref 1.01–1.73)

## 2013-03-27 LAB — CANCER ANTIGEN 19-9: CA 19 9: 19445.6 U/mL — AB (ref <35.0)

## 2013-03-27 LAB — CEA: CEA: 24 ng/mL — ABNORMAL HIGH (ref 0.0–5.0)

## 2013-03-28 ENCOUNTER — Other Ambulatory Visit: Payer: Self-pay | Admitting: Radiology

## 2013-03-29 ENCOUNTER — Telehealth (HOSPITAL_COMMUNITY): Payer: Self-pay | Admitting: *Deleted

## 2013-03-29 NOTE — Telephone Encounter (Signed)
Jeremy Ray called and said her Father, Mr. Huskins elected not to do the Biopsy and have any treatments but would like for [place] to manage his pain and give him palliative care. He will keep his Dr's appointment on the 15th.

## 2013-03-30 ENCOUNTER — Ambulatory Visit (HOSPITAL_COMMUNITY): Payer: Medicare Other

## 2013-03-30 ENCOUNTER — Ambulatory Visit (HOSPITAL_COMMUNITY): Admission: RE | Admit: 2013-03-30 | Payer: Medicare Other | Source: Ambulatory Visit

## 2013-04-03 ENCOUNTER — Other Ambulatory Visit (HOSPITAL_COMMUNITY): Payer: Self-pay | Admitting: Hematology and Oncology

## 2013-04-03 ENCOUNTER — Other Ambulatory Visit (HOSPITAL_COMMUNITY): Payer: Self-pay | Admitting: Urology

## 2013-04-03 DIAGNOSIS — N400 Enlarged prostate without lower urinary tract symptoms: Secondary | ICD-10-CM

## 2013-04-05 ENCOUNTER — Ambulatory Visit (HOSPITAL_COMMUNITY): Payer: Medicare Other

## 2013-04-06 ENCOUNTER — Ambulatory Visit (HOSPITAL_COMMUNITY)
Admission: RE | Admit: 2013-04-06 | Discharge: 2013-04-06 | Disposition: A | Discharge status: Home / Self Care | Payer: Medicare Other | Source: Ambulatory Visit | Attending: Urology | Admitting: Urology

## 2013-04-06 ENCOUNTER — Emergency Department (HOSPITAL_COMMUNITY)
Admission: EM | Admit: 2013-04-06 | Discharge: 2013-04-06 | Disposition: A | Discharge status: Home / Self Care | Payer: Medicare Other | Attending: Emergency Medicine | Admitting: Emergency Medicine

## 2013-04-06 ENCOUNTER — Encounter (HOSPITAL_COMMUNITY): Payer: Self-pay | Admitting: Emergency Medicine

## 2013-04-06 DIAGNOSIS — N508 Other specified disorders of male genital organs: Secondary | ICD-10-CM | POA: Insufficient documentation

## 2013-04-06 DIAGNOSIS — N390 Urinary tract infection, site not specified: Secondary | ICD-10-CM | POA: Insufficient documentation

## 2013-04-06 DIAGNOSIS — N401 Enlarged prostate with lower urinary tract symptoms: Secondary | ICD-10-CM | POA: Insufficient documentation

## 2013-04-06 DIAGNOSIS — N138 Other obstructive and reflux uropathy: Secondary | ICD-10-CM | POA: Insufficient documentation

## 2013-04-06 DIAGNOSIS — R339 Retention of urine, unspecified: Secondary | ICD-10-CM

## 2013-04-06 DIAGNOSIS — Z8509 Personal history of malignant neoplasm of other digestive organs: Secondary | ICD-10-CM | POA: Insufficient documentation

## 2013-04-06 DIAGNOSIS — T83011A Breakdown (mechanical) of indwelling urethral catheter, initial encounter: Secondary | ICD-10-CM

## 2013-04-06 DIAGNOSIS — T83091A Other mechanical complication of indwelling urethral catheter, initial encounter: Secondary | ICD-10-CM | POA: Insufficient documentation

## 2013-04-06 DIAGNOSIS — N400 Enlarged prostate without lower urinary tract symptoms: Secondary | ICD-10-CM

## 2013-04-06 DIAGNOSIS — C259 Malignant neoplasm of pancreas, unspecified: Secondary | ICD-10-CM | POA: Insufficient documentation

## 2013-04-06 DIAGNOSIS — N433 Hydrocele, unspecified: Secondary | ICD-10-CM | POA: Insufficient documentation

## 2013-04-06 DIAGNOSIS — Z8739 Personal history of other diseases of the musculoskeletal system and connective tissue: Secondary | ICD-10-CM | POA: Insufficient documentation

## 2013-04-06 DIAGNOSIS — K219 Gastro-esophageal reflux disease without esophagitis: Secondary | ICD-10-CM | POA: Insufficient documentation

## 2013-04-06 DIAGNOSIS — Y846 Urinary catheterization as the cause of abnormal reaction of the patient, or of later complication, without mention of misadventure at the time of the procedure: Secondary | ICD-10-CM | POA: Insufficient documentation

## 2013-04-06 DIAGNOSIS — Z79899 Other long term (current) drug therapy: Secondary | ICD-10-CM | POA: Insufficient documentation

## 2013-04-06 DIAGNOSIS — Z87891 Personal history of nicotine dependence: Secondary | ICD-10-CM | POA: Insufficient documentation

## 2013-04-06 HISTORY — DX: Benign prostatic hyperplasia without lower urinary tract symptoms: N40.0

## 2013-04-06 LAB — URINALYSIS W MICROSCOPIC + REFLEX CULTURE
GLUCOSE, UA: 100 mg/dL — AB
KETONES UR: NEGATIVE mg/dL
Nitrite: POSITIVE — AB
Specific Gravity, Urine: 1.02 (ref 1.005–1.030)
Urobilinogen, UA: 1 mg/dL (ref 0.0–1.0)
pH: 6.5 (ref 5.0–8.0)

## 2013-04-06 MED ORDER — CEPHALEXIN 500 MG PO CAPS: 500.0000 mg | ORAL_CAPSULE | Freq: Four times a day (QID) | ORAL | Status: DC

## 2013-04-06 NOTE — Discharge Instructions (Signed)
°Emergency Department Resource Guide °1) Find a Doctor and Pay Out of Pocket °Although you won't have to find out who is covered by your insurance plan, it is a good idea to ask around and get recommendations. You will then need to call the office and see if the doctor you have chosen will accept you as a new patient and what types of options they offer for patients who are self-pay. Some doctors offer discounts or will set up payment plans for their patients who do not have insurance, but you will need to ask so you aren't surprised when you get to your appointment. ° °2) Contact Your Local Health Department °Not all health departments have doctors that can see patients for sick visits, but many do, so it is worth a call to see if yours does. If you don't know where your local health department is, you can check in your phone book. The CDC also has a tool to help you locate your state's health department, and many state websites also have listings of all of their local health departments. ° °3) Find a Walk-in Clinic °If your illness is not likely to be very severe or complicated, you may want to try a walk in clinic. These are popping up all over the country in pharmacies, drugstores, and shopping centers. They're usually staffed by nurse practitioners or physician assistants that have been trained to treat common illnesses and complaints. They're usually fairly quick and inexpensive. However, if you have serious medical issues or chronic medical problems, these are probably not your best option. ° °No Primary Care Doctor: °- Call [facility] at  832-8000 - they can help you locate a primary care doctor that  accepts your insurance, provides certain services, etc. °- Physician Referral Service- 1-800-533-3463 ° °Chronic Pain Problems: °Organization         Address  Phone   Notes  °[facility]  ([phone]) [phone] Patients need to be referred by their primary care doctor.  ° °Medication  Assistance: °Organization         Address  Phone   Notes  °[place] [facility] [address] °[place], [place] [address] (336) 641-8030 --Must be a resident of [place] °-- Must have NO insurance coverage whatsoever (no Medicaid/ Medicare, etc.) °-- The pt. MUST have a primary care doctor that directs their care regularly and follows them in the community °  °[facility]  (866) 331-1348   °[employer]  (888) 892-1162   ° °Agencies that provide inexpensive medical care: °Organization         Address  Phone   Notes  °[facility]  (336) 832-8035   °[facility]    (336) 832-7272   °[facility] [address] °[place], [place] [address] (336) 832-4777   °[facility] [address], °[place] (336) 271-4999   °[facility]    (336) 373-0678   °[facility]    (336) 272-1050   °[facility] ° [address], [place] Phone:  (336) 832-4444, Fax:  (336) 832-4440 Hours of Operation:  9 am - 6 pm, M-F.  Also accepts Medicaid/Medicare and self-pay.  °[facility] ° [address], [place] Phone: (336) [phone], Fax: (336) [phone]. Hours of Operation:  8:30 am - 5:30 pm, M-F.  Also accepts Medicaid and self-pay.  °[facility] [place] [address], [place] Phone: (336) 878-6027   °[facility] [address], [place], [place] (336)723-1848, Ext. 123 Mondays & Thursdays: 7-9 AM.  First 15 patients are seen on a first come, first serve basis. °  ° °Medicaid-accepting [place] Providers: ° °Organization         Address  Phone   Notes  °[facility] [address], [place] (336) 641-2100 Also accepts self-pay patients.  °[facility] [address], [place] ° (336) 856-9996   °[facility] [address], [place]  (336) 288-8857   °[facility] [address] [place] [address], [place] (336) 299-7000   °Veita Bland [address], [place]  ° (336) 373-1557 Only accepts [place] Access Medicaid patients after they have their name applied to their card.  ° °Self-Pay (no insurance) in [place]: ° °Organization         Address  Phone   Notes  °Sickle Cell Patients, [facility] [address], [place] (336) 832-1970   °[facility] [address], [place] (336) 832-4400   °[facility] ° [address], [place] (336) 992-4800   °[facility]/Dr. Osei-Bonsu ° [address] [place] [address], [place] or [address], [place] (336) [phone] Phone number for both [place] and [place] locations is the same.  °[facility] [address], [place] (336) 299-0000   °[facility] [address] [place] [address], [place] or [address] (336) 852-7530 °(336) 878-2260   °[facility] [address], [place] (336) 350-1642, phone; (336) 294-5005, fax Sees patients 1st and 3rd Saturday of every month.  Must not qualify for public or private insurance (i.e. Medicaid, Medicare, [place] Health Choice, Veterans' Benefits) • Household income should be no more than 200% of the poverty level •The clinic cannot treat you if you are pregnant or think you are pregnant • Sexually transmitted diseases are not treated at the clinic.  ° ° °Dental Care: °Organization         Address  Phone  Notes  °[place] [facility] [address], [place] (336) 641-6152 Accepts children up to age 21 who are enrolled in Medicaid or [place] Health Choice; pregnant women with a Medicaid card; and children who have applied for Medicaid or [place] Health Choice, but were declined, whose parents can pay a reduced fee at time of service.  °[place]  [facility] [place]  [address], [place] (336) 641-7733 Accepts children up to age 21 who are enrolled in Medicaid or [place] Health Choice; pregnant women with a Medicaid card; and children who have applied for Medicaid or  Health Choice, but were declined, whose parents can pay a reduced fee at time of service.  °[facility] ° [address],  (336) 641-4533 Patients are seen by appointment only. Walk-ins are not accepted. [facility] will see patients 18 years of age and older. °Monday - Tuesday (8am-5pm) °Most Wednesdays (8:30-5pm) °$30 per visit, cash only  °[facility] ° [address], [place] (336) 641-4533 Patients are seen by appointment only. Walk-ins are not accepted. [facility] will see patients 18 years of age and older. °One   Wednesday Evening (Monthly: Volunteer Based).  $30 per visit, cash only  °[facility]  (919) [phone] for adults; Children under age 4, call Graduate Pediatric Dentistry at ([phone]) [phone]. Children aged 4-14, please call (919) [phone] to request a pediatric application. ° Dental services are provided in all areas of dental care including fillings, crowns and bridges, complete and partial dentures, implants, gum treatment, root canals, and extractions. Preventive care is also provided. Treatment is provided to both adults and children. °Patients are selected via a lottery and there is often a waiting list. °  °[facility] [address], °[place] ° (336) 763-8833 [email or web address] °  °[facility] [address], [place], [place] (336)723-1848, Ext. 123 Second and Fourth Thursday of each month, opens at 6:30 AM; Clinic ends at 9 AM.  Patients are seen on a first-come first-served basis, and a limited number are seen during each clinic.  ° °[facility] ° [address], [place], [place] (336) [phone]    Eligibility Requirements °You must have lived in [place], [place], or [place] counties for at least the last three months. °  You cannot be eligible for state or federal sponsored healthcare insurance, including Veterans Administration, Medicaid, or Medicare. °  You generally cannot be eligible for healthcare insurance through your employer.  °  How to apply: °Eligibility screenings are held every Tuesday and Wednesday afternoon from 1:00 pm until 4:00 pm. You do not need an appointment for the interview!  °[facility] [address], [place], [place] 336-631-2330   °[facility]  336-342-8273   °[facility]  336-703-3100   °[facility]  336-570-6415   ° °Behavioral Health Resources in the Community: °Intensive Outpatient Programs °Organization         Address  Phone  Notes  °[facility] [address], [place], [place] 336-878-6098   °[facility] [address], [place], [place] 336-832-9800   °[facility] [address], [place], [place] ° 336-882-2125   °[facility] [address],  °[place], [place] 1-800-853-5163 or 336-641-4981   °Substance Abuse Resources °Organization         Address  Phone  Notes  °[facility]  336-882-2125   °[facility]  336-784-9470   °[facility]  336-285-9073   °[facility]  336-845-3988   °Residential & Outpatient Substance Abuse Program  1-800-659-3381   °Psychological Services °Organization         Address  Phone  Notes  °[facility]  336- 832-9600   °[facility]  336- 378-7881   °[facility] [address], [place] 1-800-853-5163 or 336-641-4981   ° °Mobile Crisis Teams °Organization         Address  Phone  Notes  °[facility], Mobile Crisis Care Unit  1-877-626-1772   °Assertive °[facility] ° [address]  [place], [place] 336-834-9664   °Sharon DeEsch [address] °[place] 336-554-5454   ° °Self-Help/Support Groups °Organization         Address  Phone             Notes  °[facility] [place] - variety of support groups  336- 373-1402 Call for more information  °Narcotics Anonymous (NA), [facility] [address], °[place]  2 meetings at this location  ° °  Residential Treatment Programs Organization         Address  Phone  Notes  [facility] [address],    [place]  1-785 830 4695   [facility]  [address], [place] [address], [place], [place]   [place], [place] [phone] Admissions: 8am-3pm M-F  [facility] [place] [address]    [place], [place] 270-350-0938   [facility] [address] [place], [place], [place]   [facility] [address]  [place], [place]   [facility] Intensive Outpatient [address], [place], [place]   [facility] ([place]  [place], [place] [phone] or [phone]   [facility] [address], [place], [place] Accepts Medicaid  [facility] [address]  [place] 1-731-184-1453 Substance Abuse/Addiction Treatment   [facility] Organization         Address  Phone  Notes  [facility]  716-051-0658   Domenic Schwab, PhD [address] [place], [place]   639-265-1131 or (301) 092-9444   [place], [place] [phone]   [facility] [address] [place], [place] [phone] Insurance/Medicaid/sponsorship through [facility] and Families [address] [place]                                    [place], [place] [phone] [place] [address], [place] [phone]    Dr. Adele Schilder  [phone]   [facility] Dept. 1) [address] [place] 2) [place] 3)  [address], [place] (864) [phone]  [phone]   [place] [phone] or ([phone] (After Hours)       Take the prescription as directed.  Call your regular Urologist on Monday to schedule a follow up appointment within the next 3 days.  Return to the Emergency Department immediately sooner if worsening.

## 2013-04-06 NOTE — ED Notes (Signed)
Pt has urinary catheter in place, changed his leg bag last night and since has not been draining properly and has noted blood in his tubing and bag. Feels like he has a full bladder.

## 2013-04-06 NOTE — ED Notes (Addendum)
Irrigated existing foley without difficulty. Only a small amount of saline returned. Pt complain of a lot of pressure in bladder. Foley changed, # 16 french and immediately 500 cc of blood tinged urine returned. Pt tolerated procedure well

## 2013-04-06 NOTE — ED Provider Notes (Signed)
CSN: [id number]     Arrival date & time 04/06/13  1310 History   First MD Initiated Contact with Patient 04/06/13 1318     Chief Complaint  Patient presents with  . Urinary Retention  . Hematuria    HPI Pt was seen at 1325. Per pt, c/o gradual onset and persistence of constant "problem with my foley catheter" since last night. Pt states the foley was placed by Uro Dr. Michela Pitcher approx 2 to 3 days ago for urinary retention. States he changed the catheter bag last night "and now it's not draining right." States he has seen "some blood" in the catheter tubing and bag today after he "might have pulled on the leg bag and the catheter a little bit" last night. Denies abd pain, no back pain, no dysuria, no N/V/D, no fevers, no testicular pain/swelling, no penile pain or bleeding.     Past Medical History  Diagnosis Date  . GERD (gastroesophageal reflux disease)   . Arthritis   . Pancreatic cancer   . BPH (benign prostatic hypertrophy)    Past Surgical History  Procedure Laterality Date  . Tonsillectomy    . Hernia repair Right     Patient states that this was 50 years ago  . Ercp N/A 03/02/2013    Procedure: ENDOSCOPIC RETROGRADE CHOLANGIOPANCREATOGRAPHY (ERCP), BRUSHINGS OF BILE DUCT;  Surgeon: Rogene Houston, MD;  Location: [facility];  Service: Endoscopy;  Laterality: N/A;  . Sphincterotomy N/A 03/02/2013    Procedure: SPHINCTEROTOMY;  Surgeon: Rogene Houston, MD;  Location: [facility];  Service: Endoscopy;  Laterality: N/A;  . Biliary stent placement N/A 03/02/2013    Procedure: BILIARY WALL STENT PLACEMENT;  Surgeon: Rogene Houston, MD;  Location: [facility];  Service: Endoscopy;  Laterality: N/A;  . Esophagogastroduodenoscopy N/A 03/02/2013    Procedure: ESOPHAGOGASTRODUODENOSCOPY (EGD);  Surgeon: Rogene Houston, MD;  Location: [facility];  Service: Endoscopy;  Laterality: N/A;   Family History  Problem Relation Age of Onset  . Stroke Father   . Pancreatic cancer Brother   . Cirrhosis Brother    . Healthy Son   . Healthy Daughter    History  Substance Use Topics  . Smoking status: Former Smoker    Types: Cigars    Quit date: 03/01/1963  . Smokeless tobacco: Former Systems developer    Types: Chew    Quit date: 02/28/1973  . Alcohol Use: No    Review of Systems ROS: Statement: All systems negative except as marked or noted in the HPI; Constitutional: Negative for fever and chills. ; ; Eyes: Negative for eye pain, redness and discharge. ; ; ENMT: Negative for ear pain, hoarseness, nasal congestion, sinus pressure and sore throat. ; ; Cardiovascular: Negative for chest pain, palpitations, diaphoresis, dyspnea and peripheral edema. ; ; Respiratory: Negative for cough, wheezing and stridor. ; ; Gastrointestinal: Negative for nausea, vomiting, diarrhea, abdominal pain, blood in stool, hematemesis, jaundice and rectal bleeding. . ; ; Genitourinary: +urinary retention, hematuria. Negative for dysuria, flank pain. ; ; Musculoskeletal: Negative for back pain and neck pain. Negative for swelling and trauma.; ; Skin: Negative for pruritus, rash, abrasions, blisters, bruising and skin lesion.; ; Neuro: Negative for headache, lightheadedness and neck stiffness. Negative for weakness, altered level of consciousness , altered mental status, extremity weakness, paresthesias, involuntary movement, seizure and syncope.       Allergies  Review of patient's allergies indicates no known allergies.  Home Medications   Current Outpatient Rx  Name  Route  Sig  Dispense  Refill  . omeprazole (PRILOSEC) 20 MG capsule   Oral   Take 1 capsule (20 mg total) by mouth 2 (two) times daily before a meal.   60 capsule   5   . Pancrelipase, Lip-Prot-Amyl, 24000 UNITS CPEP      Take 2 capsules each meal and one with snack.   240 capsule   5   . traMADol (ULTRAM) 50 MG tablet   Oral   Take 50 mg by mouth every 6 (six) hours as needed for moderate pain.           BP 89/60  Pulse 94  Temp(Src) 98.6 F (37 C)  (Oral)  Resp 18  Ht 5\' 9"  (1.753 m)  Wt 154 lb (69.854 kg)  BMI 22.73 kg/m2  SpO2 98% Physical Exam 1330: Physical examination:  Nursing notes reviewed; Vital signs and O2 SAT reviewed;  Constitutional: Well developed, Well nourished, Well hydrated, In no acute distress; Head:  Normocephalic, atraumatic; Eyes: EOMI, PERRL, No scleral icterus; ENMT: Mouth and pharynx normal, Mucous membranes moist; Neck: Supple, Full range of motion, No lymphadenopathy; Cardiovascular: Regular rate and rhythm, No gallop; Respiratory: Breath sounds clear & equal bilaterally, No wheezes.  Speaking full sentences with ease, Normal respiratory effort/excursion; Chest: Nontender, Movement normal; Abdomen: Soft, Nontender, Nondistended, Normal bowel sounds; Genitourinary: No CVA tenderness. +scant urine in foley bag.; Extremities: Pulses normal, No tenderness, No edema, No calf edema or asymmetry.; Neuro: AA&Ox3, Major CN grossly intact.  Speech clear. Climbs on and off stretcher easily by himself. Gait steady. No gross focal motor or sensory deficits in extremities.; Skin: Color normal, Warm, Dry.   ED Course  Procedures   EKG Interpretation   None       MDM  MDM Reviewed: previous chart, nursing note and vitals Interpretation: labs     Results for orders placed during the hospital encounter of 04/06/13  URINALYSIS W MICROSCOPIC + REFLEX CULTURE      Result Value Range   Color, Urine RED (*) YELLOW   APPearance TURBID (*) CLEAR   Specific Gravity, Urine 1.020  1.005 - 1.030   pH 6.5  5.0 - 8.0   Glucose, UA 100 (*) NEGATIVE mg/dL   Hgb urine dipstick LARGE (*) NEGATIVE   Bilirubin Urine SMALL (*) NEGATIVE   Ketones, ur NEGATIVE  NEGATIVE mg/dL   Protein, ur >300 (*) NEGATIVE mg/dL   Urobilinogen, UA 1.0  0.0 - 1.0 mg/dL   Nitrite POSITIVE (*) NEGATIVE   Leukocytes, UA TRACE (*) NEGATIVE   RBC / HPF TOO NUMEROUS TO COUNT  <3 RBC/hpf   US Scrotum 04/06/2013   CLINICAL DATA:  Acute urinary  retention, history of hydrocele as well as benign prostatic hypertrophy and pancreatic malignancy  EXAM: ULTRASOUND OF SCROTUM  TECHNIQUE: Complete ultrasound examination of the testicles, epididymis, and other scrotal structures was performed.  COMPARISON:  None.  FINDINGS: Right testicle  Measurements: 5.1 x 2.1 x 3.1 the echotexture of the right testicle is somewhat heterogeneous. No discrete mass is demonstrated. Vascularity of the right testicle is normal. No microlithiasis is demonstrated.  Left testicle  Measurements: 4.2 x 2.9 x 3.3 cm. There is mass effect upon the left testicle due to the large left-sided hydrocele. No mass or microlithiasis visualized.  Right epididymis:  Not well demonstrated.  Left epididymis:  Not well demonstrated.  There may be of  Hydrocele: There is a large left-sided hydrocele containing septations. It measures 10.3 x 8.6 x 6.8  cm.  Varicocele:  None visualized.  IMPRESSION: 1. There is no evidence of an intratesticular mass or testicular torsion. 2. There is a tiny epididymal cyst on the left. The epididymis on the right is not well evaluated. 3. There is a large, left-sided, septated hydrocele measuring at least 10.3 cm in greatest dimension. No varicocele is demonstrated.   Electronically Signed   By: David  [place]ique   On: 04/06/2013 13:57    1700:  Foley catheter changed by ED RN with immediate return of 539ml blood tinged urine. Pt's foley continues to drain without difficulty. +hematuria, but no visualized clots. VS remain stable. Pt has climbed off the stretcher by himself, got dressed and wants to go home now. Will tx for UTI; f/u with Uro MD in 3 days. Dx and testing d/w pt and family.  Questions answered.  Verb understanding, agreeable to d/c home with outpt f/u.     Alfonzo Feller, DO 04/09/13 1120

## 2013-04-06 NOTE — ED Notes (Signed)
Irrigated foley with 150cc of sterile water with no return.  No resistance was met with irrigation. Pt denied pain during irrigation.

## 2013-04-18 NOTE — Patient Instructions (Signed)
Jeremy Ray  04/18/2013   Your procedure is scheduled on:  04/24/13  Report to [facility] at [place] AM.  Call this number if you have problems the morning of surgery: [phone]   Remember:   Do not eat food or drink liquids after midnight.   Take these medicines the morning of surgery with A SIP OF WATER: prilosec, tramadol   Do not wear jewelry, make-up or nail polish.  Do not wear lotions, powders, or perfumes. You may wear deodorant.  Do not shave 48 hours prior to surgery. Men may shave face and neck.  Do not bring valuables to the hospital.  [facility] is not responsible                  for any belongings or valuables.               Contacts, dentures or bridgework may not be worn into surgery.  Leave suitcase in the car. After surgery it may be brought to your room.  For patients admitted to the hospital, discharge time is determined by your                treatment team.               Patients discharged the day of surgery will not be allowed to drive  home.  Name and phone number of your driver:   Special Instructions: Shower using CHG 2 nights before surgery and the night before surgery.  If you shower the day of surgery use CHG.  Use special wash - you have one bottle of CHG for all showers.  You should use approximately 1/3 of the bottle for each shower.   Please read over the following fact sheets that you were given: Pain Booklet, Surgical Site Infection Prevention, Anesthesia Post-op Instructions and Care and Recovery After Surgery   How to Use Chlorhexidine Before Surgery Chlorhexidine is a germ-killing(antiseptic) solution that is used to clean the skin. It gets rid of the bacteria that usually live on the skin. Because your skin needs to be very clean before you have surgery, your caregiver may give you chlorhexidine to wash with before the surgery. You may be given chlorhexidine to use at home. For example, you may be told to wash with chlorhexidine the night  before and the morning of your surgery. Washing with chlorhexidine before surgery helps prevent infections from developing after the surgery.  HOW TO USE CHLORHEXIDINE  Only use chlorhexidine as directed by your caregiver. Do not use more chlorhexidine than you are told to, and do not use it more often than you are told. Make sure you follow the instructions on the label. If you have any questions, call your caregiver. You can use chlorhexidine while taking a shower or a bath. Unless instructed otherwise, follow these steps when using chlorhexidine: 1. Wash your face and your hair using the soap and shampoo that you normally use. 2. Turn off the shower or stand up in the tub. Then, wash with chlorhexidine. To wash with chlorhexidine:  If you were given a special sponge, use that. If you were not given a sponge, use only a cloth. Do not use any sort of brush. If the sponge has a brush on one side, do not use that side.  Start at your neck and scrub down to your toes. Avoid your genital area. Chlorhexidine can cause a skin reaction. Also, avoid any areas of skin that have cuts  or scrapes.  Pay special attention to the part of your body where you will be having surgery. Scrub this area for at least 1 minute.  Be sure to get your back and under your arms.  Do not use chlorhexidine on your head. If the solution gets into your ears or eyes, rinse them well with water. Chlorhexidine can cause hearing lossif the solution gets into your ear. It can also harm your eye if it gets in your eye and is not rinsed out. 3. Rinse your body completely in the shower or tub. Be sure that all body creases and crevices are rinsed well. 4. Dry off with a clean towel. Do not put any substances on your body afterward, such as powder, lotion, or perfume. 5. Put on clean clothing. 6. If it is the night before your surgery, sleep in clean sheets. SEEK MEDICAL CARE IF:   Your skin gets irritated after scrubbing. SEEK  IMMEDIATE MEDICAL CARE IF:   Your eyes become very red or swollen.  Your eyes itch badly.  Your skin itches badly and is red or swollen.  Your hearing changes.  You have trouble seeing.  You have trouble breathing.  You swallow any chlorhexidine. Document Released: 12/01/2011 Document Revised: 02/23/2012 Document Reviewed: 12/01/2011 [facility] Patient Information 2014 [place]. PATIENT INSTRUCTIONS POST-ANESTHESIA  IMMEDIATELY FOLLOWING SURGERY:  Do not drive or operate machinery for the first twenty four hours after surgery.  Do not make any important decisions for twenty four hours after surgery or while taking narcotic pain medications or sedatives.  If you develop intractable nausea and vomiting or a severe headache please notify your doctor immediately.  FOLLOW-UP:  Please make an appointment with your surgeon as instructed. You do not need to follow up with anesthesia unless specifically instructed to do so.  WOUND CARE INSTRUCTIONS (if applicable):  Keep a dry clean dressing on the anesthesia/puncture wound site if there is drainage.  Once the wound has quit draining you may leave it open to air.  Generally you should leave the bandage intact for twenty four hours unless there is drainage.  If the epidural site drains for more than 36-48 hours please call the anesthesia department.  QUESTIONS?:  Please feel free to call your physician or the hospital operator if you have any questions, and they will be happy to assist you.      Transurethral Resection of the Prostate Transurethral resection of the prostate (TURP) is the removal of part of your prostate to treat noncancerous (benign) prostatic hyperplasia (BPH). BPH typically occurs in men older than 40 years. It is the abnormal growth of cells in your prostate. Specifically, it is an abnormal increase in the number of cells that make up your prostate tissue. This causes an increase in the size of your prostate. Often, in  the case of BPH, the prostate becomes so large that it compresses the tube that drains urine out of your body from your bladder (urethra). Eventually, this compression can obstruct the flow of urine from your bladder. This obstruction can cause recurrent bladder infection and difficulties with bladder control and bladder emptying. The goal of TURP is to remove enough prostate tissue to allow for an unobstructed flow of urine, which often resolves the associated conditions. LET YOUR CAREGIVER KNOW ABOUT:  Any allergies you have.  Any medicines you are taking, including herbs, eye drops, over-the-counter medicines, and creams.  Any problems you have had with the use of anesthetics.  Any blood disorders  you have, including bleeding problems and clotting problems.  Previous surgeries you have had.  Any prostate infections you have had. RISKS AND COMPLICATIONS Generally, TURP is a safe procedure. However, as with any surgical procedure, complications can occur. Possible complications associated with TURP include:  Difficulty getting an erection.  Scarring, which may cause problems with the flow of your urine.  Injury to your urethra.  Incontinence from injury to the muscle that surrounds your prostate, which controls urine flow.  Infection.  Bleeding.  Injury to your bladder (rare). BEFORE THE PROCEDURE  Your caregiver will tell you when you need to stop eating and drinking. If you take any medicines, your caregiver will tell you which ones you may keep taking and which ones you will have to stop taking and when.  Just before the procedure you will also receive medicine to make you fall asleep (general anesthetic). This will be given through a tube that is inserted into one of your veins (intravenous [IV] tube). PROCEDURE Your surgeon inserts an instrument that is similar to a telescope with an electric cutting edge (resectoscope) through your urethra to the area of the prostate gland.  The cutting edge is used to remove enlarged pieces of your prostate, one piece at a time. At the end of your procedure, a flexible tube (catheter) will be inserted into your urethra to drain your bladder. Special plastic bags filled with solution will be connected to the end of the catheter. The solution will be used to irrigate blood from your bladder while you heal.  AFTER THE PROCEDURE You will be taken to the recovery area. Once you are awake, stable, and taking fluids well, you will be taken to your hospital room. Typically, you will stay in the hospital 1 2 days after this procedure. The catheter usually is removed before discharge from the hospital. Document Released: 03/08/2005 Document Revised: 12/01/2011 Document Reviewed: 08/09/2011 [facility] Patient Information 2014 [place], [place].

## 2013-04-19 ENCOUNTER — Encounter (HOSPITAL_COMMUNITY): Payer: Self-pay

## 2013-04-19 ENCOUNTER — Encounter (HOSPITAL_COMMUNITY)
Admission: RE | Admit: 2013-04-19 | Discharge: 2013-04-19 | Disposition: A | Discharge status: Home / Self Care | Payer: Medicare Other | Source: Ambulatory Visit | Attending: Urology | Admitting: Urology

## 2013-04-19 DIAGNOSIS — Z01812 Encounter for preprocedural laboratory examination: Secondary | ICD-10-CM | POA: Insufficient documentation

## 2013-04-19 DIAGNOSIS — Z01818 Encounter for other preprocedural examination: Secondary | ICD-10-CM | POA: Insufficient documentation

## 2013-04-19 LAB — CBC
HCT: 35.2 % — ABNORMAL LOW (ref 39.0–52.0)
HEMOGLOBIN: 11.5 g/dL — AB (ref 13.0–17.0)
MCH: 28.5 pg (ref 26.0–34.0)
MCHC: 32.7 g/dL (ref 30.0–36.0)
MCV: 87.3 fL (ref 78.0–100.0)
Platelets: 317 10*3/uL (ref 150–400)
RBC: 4.03 MIL/uL — ABNORMAL LOW (ref 4.22–5.81)
RDW: 14.3 % (ref 11.5–15.5)
WBC: 6.2 10*3/uL (ref 4.0–10.5)

## 2013-04-19 LAB — BASIC METABOLIC PANEL
BUN: 20 mg/dL (ref 6–23)
CALCIUM: 9.1 mg/dL (ref 8.4–10.5)
CO2: 27 mEq/L (ref 19–32)
CREATININE: 0.92 mg/dL (ref 0.50–1.35)
Chloride: 98 mEq/L (ref 96–112)
GFR calc Af Amer: 85 mL/min — ABNORMAL LOW (ref >90)
GFR calc non Af Amer: 74 mL/min — ABNORMAL LOW (ref >90)
GLUCOSE: 132 mg/dL — AB (ref 70–99)
Potassium: 4.9 mEq/L (ref 3.7–5.3)
Sodium: 138 mEq/L (ref 137–147)

## 2013-04-19 LAB — TYPE AND SCREEN
ABO/RH(D): A POS
Antibody Screen: NEGATIVE

## 2013-04-23 NOTE — H&P (Signed)
NAME:  MAXI, RODAS NO.:  [id number]  MEDICAL RECORD NO.:  [id number]  LOCATION:                                 FACILITY:  PHYSICIAN:  Marissa Nestle, M.D.DATE OF BIRTH:  Apr 21, 1923  DATE OF ADMISSION: DATE OF DISCHARGE:  LH                             HISTORY & PHYSICAL   Mr. Hunzeker is coming tomorrow to undergo TUR prostate.  CHIEF COMPLAINT:  Difficulty to void.  He was seen by me in the office on January 13 with the complaints that he is having difficulty to void, his AU score is 24.  He says during the week of last Christmas, he was diagnosed with pancreatic cancer, he does not want to have any surgery because of his age.  He was told that he has bile in his urine because of that.  Right now, he says it is not causing him any problem, he is complaining of some suprapubic discomfort which he attributes to difficulty to void.  It started only 3-4 days ago, but when I talked to him he told me 4 years ago he had the same problem, he went into urinary retention and went to the emergency room, they put a Foley catheter, he was relieved.  Then, subsequently the Foley catheter was taken out by his family physician.  He says he is voiding okay since then.  His PSA was elevated to 48 at that time.  It subsequently came down to 7.5 about 4 months ago according to him.  He has no fever, chills or gross hematuria.  PAST MEDICAL HISTORY:  As I mentioned, he is recently with diagnosed pancreatic CA.  No history of diabetes or hypertension.  He is healthy otherwise.  Never had any surgery.  He says he is 80 year old, and he has been healthy all his life.  FAMILY HISTORY:  One of his brother died with prostatic cancer.  Father never had any prostate cancer.  PERSONAL HISTORY:  He does not smoke or drink.  REVIEW OF SYSTEMS:  Unremarkable.  PHYSICAL EXAMINATION:  GENERAL:  Moderately built male, not in acute distress, fully conscious, alert,  oriented. ABDOMEN:  Soft, flat.  Liver, spleen, kidneys not palpable.  No CVA tenderness. CENTRAL NERVOUS SYSTEM:  No gross neurological deficit. HEAD, NECK, EYE, ENT:  Negative. CHEST:  Symmetrical.  Normal breath sounds. HEART:  Regular sinus rhythm.  No murmur. Abdomen as I mentioned above.  The bladder was found to have markedly distended in the suprapubic area, so after putting a Foley catheter we drained about 1000 mL of fluid from his bladder and the suprapubic distention disappeared.  We removed about 800 mL of urine from his bladder, left the Foley catheter in and schedule him for cystoscopy and systematic's cystoscopy shows that he has a very large prostate causing bladder neck obstruction, so I have told him to undergo TUR prostate for which he is coming as outpatient tomorrow.  I have discussed that I can do open prostatectomy or I have to do TUR prostate maybe in 2 stages, because his prostate is rather large.  He understands, so I am bringing him in, I will see him back and finished his TUR  prostate in 1 sitting or 2 sittings.  He does not want me to do open prostatectomy. Procedure, limitation, and complications are discussed in detail.  He understands and want me to go out to proceed, so he is coming as outpatient tomorrow, will stay overnight in the hospital.  If everything okay, I will send him home with the Foley catheter the next day.  IMPRESSION:  Benign prostatic hyperplasia with bladder neck obstruction and pancreatic cancer by history, elevated PSA, age 80 years old.     Marissa Nestle, M.D.     MIJ/MEDQ  D:  04/23/2013  T:  04/23/2013  Job:  [id number]

## 2013-04-24 ENCOUNTER — Encounter (HOSPITAL_COMMUNITY): Payer: Medicare Other | Admitting: Anesthesiology

## 2013-04-24 ENCOUNTER — Encounter (HOSPITAL_COMMUNITY): Payer: Self-pay | Admitting: *Deleted

## 2013-04-24 ENCOUNTER — Encounter (HOSPITAL_COMMUNITY)
Admission: RE | Disposition: A | Discharge status: Home / Self Care | Payer: Self-pay | Source: Ambulatory Visit | Attending: Urology

## 2013-04-24 ENCOUNTER — Ambulatory Visit (HOSPITAL_COMMUNITY): Payer: Medicare Other | Admitting: Anesthesiology

## 2013-04-24 ENCOUNTER — Observation Stay (HOSPITAL_COMMUNITY)
Admission: RE | Admit: 2013-04-24 | Discharge: 2013-04-25 | Disposition: A | Discharge status: Home / Self Care | Payer: Medicare Other | Source: Ambulatory Visit | Attending: Urology | Admitting: Urology

## 2013-04-24 DIAGNOSIS — N138 Other obstructive and reflux uropathy: Principal | ICD-10-CM | POA: Insufficient documentation

## 2013-04-24 DIAGNOSIS — Z87891 Personal history of nicotine dependence: Secondary | ICD-10-CM | POA: Insufficient documentation

## 2013-04-24 DIAGNOSIS — K219 Gastro-esophageal reflux disease without esophagitis: Secondary | ICD-10-CM | POA: Insufficient documentation

## 2013-04-24 DIAGNOSIS — N401 Enlarged prostate with lower urinary tract symptoms: Secondary | ICD-10-CM | POA: Diagnosis present

## 2013-04-24 DIAGNOSIS — C259 Malignant neoplasm of pancreas, unspecified: Secondary | ICD-10-CM | POA: Insufficient documentation

## 2013-04-24 DIAGNOSIS — R339 Retention of urine, unspecified: Secondary | ICD-10-CM | POA: Insufficient documentation

## 2013-04-24 HISTORY — PX: TRANSURETHRAL RESECTION OF PROSTATE: SHX73

## 2013-04-24 LAB — GLUCOSE, CAPILLARY: Glucose-Capillary: 142 mg/dL — ABNORMAL HIGH (ref 70–99)

## 2013-04-24 SURGERY — TURP (TRANSURETHRAL RESECTION OF PROSTATE)
Anesthesia: Spinal

## 2013-04-24 MED ORDER — CEFTRIAXONE SODIUM 1 G IJ SOLR: 1.0000 g | Freq: Once | INTRAMUSCULAR | Status: DC

## 2013-04-24 MED ORDER — EPHEDRINE SULFATE 50 MG/ML IJ SOLN
INTRAMUSCULAR | Status: AC
  Filled 2013-04-24: qty 1

## 2013-04-24 MED ORDER — EPHEDRINE SULFATE 50 MG/ML IJ SOLN
INTRAMUSCULAR | Status: DC | PRN
  Administered 2013-04-24 (×3): 5 mg via INTRAVENOUS
  Administered 2013-04-24: 10 mg via INTRAVENOUS

## 2013-04-24 MED ORDER — BUPIVACAINE IN DEXTROSE 0.75-8.25 % IT SOLN
INTRATHECAL | Status: AC
  Filled 2013-04-24: qty 2

## 2013-04-24 MED ORDER — TRAMADOL HCL 50 MG PO TABS
50.0000 mg | ORAL_TABLET | Freq: Four times a day (QID) | ORAL | Status: DC | PRN
  Administered 2013-04-25: 50 mg via ORAL
  Filled 2013-04-24: qty 1

## 2013-04-24 MED ORDER — SODIUM CHLORIDE BACTERIOSTATIC 0.9 % IJ SOLN
INTRAMUSCULAR | Status: AC
  Filled 2013-04-24: qty 10

## 2013-04-24 MED ORDER — PROPOFOL INFUSION 10 MG/ML OPTIME
INTRAVENOUS | Status: DC | PRN
  Administered 2013-04-24: 25 ug/kg/min via INTRAVENOUS

## 2013-04-24 MED ORDER — GLYCINE 1.5 % IR SOLN
Status: DC | PRN
  Administered 2013-04-24 (×5): 3000 mL

## 2013-04-24 MED ORDER — FENTANYL CITRATE 0.05 MG/ML IJ SOLN
INTRAMUSCULAR | Status: AC
  Filled 2013-04-24: qty 2

## 2013-04-24 MED ORDER — PHENYLEPHRINE HCL 10 MG/ML IJ SOLN
INTRAMUSCULAR | Status: AC
  Filled 2013-04-24: qty 1

## 2013-04-24 MED ORDER — FENTANYL CITRATE 0.05 MG/ML IJ SOLN
INTRAMUSCULAR | Status: DC | PRN
  Administered 2013-04-24: 25 ug via INTRATHECAL

## 2013-04-24 MED ORDER — DEXTROSE 5 % IV SOLN
INTRAVENOUS | Status: AC
  Filled 2013-04-24: qty 10

## 2013-04-24 MED ORDER — FENTANYL CITRATE 0.05 MG/ML IJ SOLN: 25.0000 ug | INTRAMUSCULAR | Status: DC | PRN

## 2013-04-24 MED ORDER — SUCCINYLCHOLINE CHLORIDE 20 MG/ML IJ SOLN
INTRAMUSCULAR | Status: AC
  Filled 2013-04-24: qty 1

## 2013-04-24 MED ORDER — PROPOFOL 10 MG/ML IV EMUL
INTRAVENOUS | Status: AC
  Filled 2013-04-24: qty 20

## 2013-04-24 MED ORDER — ARTIFICIAL TEARS OP OINT
TOPICAL_OINTMENT | OPHTHALMIC | Status: AC
  Filled 2013-04-24: qty 3.5

## 2013-04-24 MED ORDER — GLYCINE 1.5 % IR SOLN
Status: DC | PRN
  Administered 2013-04-24: 3000 mL

## 2013-04-24 MED ORDER — LIDOCAINE HCL (PF) 1 % IJ SOLN
INTRAMUSCULAR | Status: AC
  Filled 2013-04-24: qty 5

## 2013-04-24 MED ORDER — PANTOPRAZOLE SODIUM 20 MG PO TBEC
20.0000 mg | DELAYED_RELEASE_TABLET | Freq: Two times a day (BID) | ORAL | Status: DC
  Filled 2013-04-24: qty 1

## 2013-04-24 MED ORDER — STERILE WATER FOR IRRIGATION IR SOLN
Status: DC | PRN
  Administered 2013-04-24: 1000 mL

## 2013-04-24 MED ORDER — CEFTRIAXONE SODIUM 1 G IJ SOLR
1.0000 g | INTRAMUSCULAR | Status: DC
  Administered 2013-04-25: 1 g via INTRAVENOUS
  Filled 2013-04-24 (×2): qty 10

## 2013-04-24 MED ORDER — BUPIVACAINE IN DEXTROSE 0.75-8.25 % IT SOLN
INTRATHECAL | Status: DC | PRN
  Administered 2013-04-24: 15 mg via INTRATHECAL

## 2013-04-24 MED ORDER — LACTATED RINGERS IV SOLN
INTRAVENOUS | Status: DC
  Administered 2013-04-24: 1000 mL via INTRAVENOUS

## 2013-04-24 MED ORDER — GLYCOPYRROLATE 0.2 MG/ML IJ SOLN
INTRAMUSCULAR | Status: AC
  Filled 2013-04-24: qty 4

## 2013-04-24 MED ORDER — DEXTROSE 5 % IV SOLN
1.0000 g | INTRAVENOUS | Status: DC | PRN
  Administered 2013-04-24: 1 g via INTRAVENOUS

## 2013-04-24 MED ORDER — SODIUM CHLORIDE BACTERIOSTATIC 0.9 % IJ SOLN
INTRAMUSCULAR | Status: AC
  Filled 2013-04-24: qty 30

## 2013-04-24 MED ORDER — ONDANSETRON HCL 4 MG/2ML IJ SOLN: 4.0000 mg | Freq: Once | INTRAMUSCULAR | Status: DC | PRN

## 2013-04-24 MED ORDER — LACTATED RINGERS IV SOLN
INTRAVENOUS | Status: DC | PRN
  Administered 2013-04-24 (×2): via INTRAVENOUS

## 2013-04-24 MED ORDER — FENTANYL CITRATE 0.05 MG/ML IJ SOLN
25.0000 ug | INTRAMUSCULAR | Status: AC
  Administered 2013-04-24 (×2): 25 ug via INTRAVENOUS

## 2013-04-24 MED ORDER — PANCRELIPASE (LIP-PROT-AMYL) 12000-38000 UNITS PO CPEP: 1.0000 | ORAL_CAPSULE | ORAL | Status: DC | PRN

## 2013-04-24 MED ORDER — HYDROMORPHONE HCL PF 1 MG/ML IJ SOLN
0.5000 mg | INTRAMUSCULAR | Status: DC | PRN
  Administered 2013-04-25: 0.5 mg via INTRAVENOUS
  Filled 2013-04-24: qty 1

## 2013-04-24 MED ORDER — PANCRELIPASE (LIP-PROT-AMYL) 12000-38000 UNITS PO CPEP
2.0000 | ORAL_CAPSULE | Freq: Three times a day (TID) | ORAL | Status: DC
  Administered 2013-04-25 (×2): 2 via ORAL
  Filled 2013-04-24 (×2): qty 2

## 2013-04-24 MED ORDER — MIDAZOLAM HCL 2 MG/2ML IJ SOLN
1.0000 mg | INTRAMUSCULAR | Status: DC | PRN
  Administered 2013-04-24 (×2): 1 mg via INTRAVENOUS

## 2013-04-24 MED ORDER — ROCURONIUM BROMIDE 50 MG/5ML IV SOLN
INTRAVENOUS | Status: AC
  Filled 2013-04-24: qty 1

## 2013-04-24 MED ORDER — DEXTROSE-NACL 5-0.45 % IV SOLN
INTRAVENOUS | Status: DC
  Administered 2013-04-24 – 2013-04-25 (×3): via INTRAVENOUS

## 2013-04-24 MED ORDER — MIDAZOLAM HCL 2 MG/2ML IJ SOLN
INTRAMUSCULAR | Status: AC
  Filled 2013-04-24: qty 2

## 2013-04-24 SURGICAL SUPPLY — 41 items
BAG DECANTER FOR FLEXI CONT (MISCELLANEOUS) ×3 IMPLANT
BAG DRAIN URO TABLE W/ADPT NS (DRAPE) ×3 IMPLANT
BAG URINE DRAIN TURP 4L (OSTOMY) ×3 IMPLANT
CABLE HI FREQUENCY MONOPOLAR (ELECTROSURGICAL) ×3 IMPLANT
CATH FOLEY 3WAY 30CC 22F (CATHETERS) ×3 IMPLANT
CLOTH BEACON ORANGE TIMEOUT ST (SAFETY) ×3 IMPLANT
CONNECTOR 5 IN 1 STRAIGHT STRL (MISCELLANEOUS) ×3 IMPLANT
DRAPE STERI URO 23X35 APER SZ5 (DRAPE) ×3 IMPLANT
DRAPE WARM FLUID 44X44 (DRAPE) ×3 IMPLANT
ELECT CUT LOOP C-MAX 27FR .012 (CUTTING LOOP) ×3
ELECT REM PT RETURN 9FT ADLT (ELECTROSURGICAL) ×3
ELECTRODE CUT LP CMX 27FR .012 (CUTTING LOOP) ×1 IMPLANT
ELECTRODE REM PT RTRN 9FT ADLT (ELECTROSURGICAL) ×1 IMPLANT
EVACUATOR ELLICK (MISCELLANEOUS) ×6 IMPLANT
FLOOR PAD 36X40 (MISCELLANEOUS) ×3
FORMALIN 10 PREFIL 480ML (MISCELLANEOUS) ×3 IMPLANT
GLOVE BIO SURGEON STRL SZ7 (GLOVE) ×3 IMPLANT
GLOVE BIOGEL PI IND STRL 6.5 (GLOVE) ×1 IMPLANT
GLOVE BIOGEL PI IND STRL 7.0 (GLOVE) ×2 IMPLANT
GLOVE BIOGEL PI IND STRL 8.5 (GLOVE) ×1 IMPLANT
GLOVE BIOGEL PI INDICATOR 6.5 (GLOVE) ×2
GLOVE BIOGEL PI INDICATOR 7.0 (GLOVE) ×4
GLOVE BIOGEL PI INDICATOR 8.5 (GLOVE) ×2
GLOVE ECLIPSE 8.5 STRL (GLOVE) ×3 IMPLANT
GLOVE SS BIOGEL STRL SZ 6.5 (GLOVE) ×1 IMPLANT
GLOVE SUPERSENSE BIOGEL SZ 6.5 (GLOVE) ×2
GLYCINE 1.5% IRRIG UROMATIC (IV SOLUTION) ×18 IMPLANT
GOWN STRL REUS W/TWL LRG LVL3 (GOWN DISPOSABLE) ×3 IMPLANT
GOWN STRL REUS W/TWL XL LVL3 (GOWN DISPOSABLE) ×3 IMPLANT
IV NS IRRIG 3000ML ARTHROMATIC (IV SOLUTION) ×6 IMPLANT
KIT ROOM TURNOVER AP CYSTO (KITS) ×3 IMPLANT
MANIFOLD NEPTUNE II (INSTRUMENTS) ×3 IMPLANT
PACK CYSTO (CUSTOM PROCEDURE TRAY) ×3 IMPLANT
PAD ARMBOARD 7.5X6 YLW CONV (MISCELLANEOUS) ×3 IMPLANT
PAD FLOOR 36X40 (MISCELLANEOUS) ×1 IMPLANT
SET IRRIGATING DISP (SET/KITS/TRAYS/PACK) ×3 IMPLANT
SYR 30ML LL (SYRINGE) ×3 IMPLANT
TOWEL OR 17X26 4PK STRL BLUE (TOWEL DISPOSABLE) ×3 IMPLANT
WATER STERILE IRR 1000ML POUR (IV SOLUTION) ×3 IMPLANT
XPEEDA 550 SIDEFIRING FIBER (MISCELLANEOUS) IMPLANT
YANKAUER SUCT BULB TIP 10FT TU (MISCELLANEOUS) ×3 IMPLANT

## 2013-04-24 NOTE — Anesthesia Postprocedure Evaluation (Signed)
Anesthesia Post Note  Patient: Jeremy Ray  Procedure(s) Performed: Procedure(s) (LRB): TRANSURETHRAL RESECTION OF THE PROSTATE (TURP) (N/A)  Anesthesia type: Spinal  Patient location: PACU  Post pain: Pain level controlled  Post assessment: Post-op Vital signs reviewed, warm blankets around patient. Patient's Cardiovascular Status Stable, Respiratory Function Stable, Patent Airway, No signs of Nausea or vomiting and Pain level controlled  Last Vitals:  Filed Vitals:   04/24/13 0935  BP: 93/47  Pulse: 60  Temp: 35.6 C  Resp: 18    Post vital signs: Reviewed and stable  Level of consciousness: awake and alert   Complications: No apparent anesthesia complications

## 2013-04-24 NOTE — Anesthesia Preprocedure Evaluation (Signed)
Anesthesia Evaluation  Patient identified by MRN, date of birth, ID band Patient awake    Reviewed: Allergy & Precautions, H&P , NPO status , Patient's Chart, lab work & pertinent test results  Airway Mallampati: I TM Distance: >3 FB     Dental  (+) Edentulous Upper and Edentulous Lower   Pulmonary former smoker,  breath sounds clear to auscultation        Cardiovascular + dysrhythmias Rhythm:Regular Rate:Normal     Neuro/Psych    GI/Hepatic GERD-  Controlled and Medicated,Pancreatic cancer, hx obstructive jaundice   Endo/Other  diabetes (borderline)  Renal/GU      Musculoskeletal   Abdominal   Peds  Hematology  (+) anemia ,   Anesthesia Other Findings   Reproductive/Obstetrics                           Anesthesia Physical Anesthesia Plan  ASA: III  Anesthesia Plan: Spinal   Post-op Pain Management:    Induction:   Airway Management Planned: Nasal Cannula  Additional Equipment:   Intra-op Plan:   Post-operative Plan:   Informed Consent: I have reviewed the patients History and Physical, chart, labs and discussed the procedure including the risks, benefits and alternatives for the proposed anesthesia with the patient or authorized representative who has indicated his/her understanding and acceptance.     Plan Discussed with:   Anesthesia Plan Comments:         Anesthesia Quick Evaluation

## 2013-04-24 NOTE — Transfer of Care (Signed)
Immediate Anesthesia Transfer of Care Note  Patient: Jeremy Ray  Procedure(s) Performed: Procedure(s) (LRB): TRANSURETHRAL RESECTION OF THE PROSTATE (TURP) (N/A)  Patient Location: PACU  Anesthesia Type: SAB  Level of Consciousness: awake  Airway & Oxygen Therapy: Patient Spontanous Breathing and non-rebreather face mask  Post-op Assessment: Report given to PACU RN, Post -op Vital signs reviewed and stable. SAB Level  T8  Post vital signs: Reviewed and stable  Complications: No apparent anesthesia complications

## 2013-04-24 NOTE — Progress Notes (Signed)
No change in rpeat H&P.

## 2013-04-24 NOTE — Brief Op Note (Signed)
04/24/2013  9:21 AM  PATIENT:  Jeremy Ray  80 y.o. male  PRE-OPERATIVE DIAGNOSIS:  Benign Prostatic Hypertrophy with Bladder Neck Obstruction  POST-OPERATIVE DIAGNOSIS:  Benign Prostatic Hypertrophy with Bladder Neck Obstruction  PROCEDURE:  Procedure(s): TRANSURETHRAL RESECTION OF THE PROSTATE (TURP) (N/A)  SURGEON:  Surgeon(s) and Role:    * Marissa Nestle, MD - Primary  PHYSICIAN ASSISTANT:   ASSISTANTS: none   ANESTHESIA:   spinal  EBL:  Total I/O In: 1000 [I.V.:1000] Out: -   BLOOD ADMINISTERED:none  DRAINS: Urinary Catheter (Foley)   LOCAL MEDICATIONS USED:  NONE  SPECIMEN:  Source of Specimen:  prostate chips and Excision  DISPOSITION OF SPECIMEN:  PATHOLOGY  COUNTS:  YES  TOURNIQUET:  * No tourniquets in log *  DICTATION: .Other Dictation: Dictation Number dictation [id number]  PLAN OF CARE: Admit for overnight observation  PATIENT DISPOSITION:  PACU - hemodynamically stable.   Delay start of Pharmacological VTE agent (>24hrs) due to surgical blood loss or risk of bleeding:

## 2013-04-24 NOTE — Progress Notes (Signed)
i told him again that it is possible he will need turp in 2 stages because his prostate is very large.

## 2013-04-24 NOTE — Addendum Note (Signed)
Addendum created 04/24/13 1147 by Ollen Bowl, CRNA   Modules edited: Anesthesia Medication Administration

## 2013-04-24 NOTE — Addendum Note (Signed)
Addendum created 04/24/13 1039 by Ollen Bowl, CRNA   Modules edited: Anesthesia Blocks and Procedures

## 2013-04-24 NOTE — Anesthesia Procedure Notes (Addendum)
Spinal  Patient location during procedure: OR Start time: 04/24/2013 8:17 AM Staffing CRNA/Resident: Tressie Stalker E Preanesthetic Checklist Completed: patient identified, site marked, surgical consent, pre-op evaluation, timeout performed, IV checked, risks and benefits discussed and monitors and equipment checked Spinal Block Patient position: right lateral decubitus Prep: Betadine Patient monitoring: heart rate, cardiac monitor, continuous pulse ox and blood pressure Approach: right paramedian Location: L3-4 Injection technique: single-shot Needle Needle type: Spinocan  Needle gauge: 22 G Needle length: 9 cm Assessment Sensory level: T8 Additional Notes ATTEMPTS:2 TRAY [id number] TRAY EXPIRATION DATE:03/2014

## 2013-04-25 ENCOUNTER — Encounter (HOSPITAL_COMMUNITY): Payer: Self-pay | Admitting: Urology

## 2013-04-25 LAB — BASIC METABOLIC PANEL
BUN: 11 mg/dL (ref 6–23)
CHLORIDE: 96 meq/L (ref 96–112)
CO2: 28 mEq/L (ref 19–32)
CREATININE: 0.79 mg/dL (ref 0.50–1.35)
Calcium: 8.6 mg/dL (ref 8.4–10.5)
GFR calc Af Amer: >90 mL/min (ref >90)
GFR calc non Af Amer: 78 mL/min — ABNORMAL LOW (ref >90)
Glucose, Bld: 224 mg/dL — ABNORMAL HIGH (ref 70–99)
Potassium: 4.2 mEq/L (ref 3.7–5.3)
Sodium: 133 mEq/L — ABNORMAL LOW (ref 137–147)

## 2013-04-25 LAB — CBC
HEMATOCRIT: 35.1 % — AB (ref 39.0–52.0)
Hemoglobin: 12 g/dL — ABNORMAL LOW (ref 13.0–17.0)
MCH: 29.3 pg (ref 26.0–34.0)
MCHC: 34.2 g/dL (ref 30.0–36.0)
MCV: 85.6 fL (ref 78.0–100.0)
Platelets: 263 10*3/uL (ref 150–400)
RBC: 4.1 MIL/uL — ABNORMAL LOW (ref 4.22–5.81)
RDW: 13.7 % (ref 11.5–15.5)
WBC: 9.9 10*3/uL (ref 4.0–10.5)

## 2013-04-25 MED ORDER — PANTOPRAZOLE SODIUM 40 MG PO TBEC
40.0000 mg | DELAYED_RELEASE_TABLET | Freq: Two times a day (BID) | ORAL | Status: DC
  Administered 2013-04-25: 40 mg via ORAL
  Filled 2013-04-25: qty 1

## 2013-04-25 NOTE — Discharge Instructions (Signed)
Call if bleeding or fever >101 Fwill see in office monday

## 2013-04-25 NOTE — Progress Notes (Signed)
Pt discharged home today per Dr. Michela Pitcher. Pt's IV site D/C'd and WNL. Pt's VS stable at this time. CBI stopped per protocol.  Leg bag placed.  Pt verbalized understanding of changing from leg bag to standard drainage bag as patient has been doing so at home for the past two weeks.  Pt provided with home medication list, discharge instructions and prescriptions. Verbalized understanding. Pt left floor via WC in stable condition accompanied by NT.  Daughter and wife at bedside at time of discharge.

## 2013-04-25 NOTE — Progress Notes (Signed)
Inpatient Diabetes Program Recommendations  AACE/ADA: New Consensus Statement on Inpatient Glycemic Control (2013)  Target Ranges:  Prepandial:   less than 140 mg/dL      Peak postprandial:   less than 180 mg/dL (1-2 hours)      Critically ill patients:  140 - 180 mg/dL   Results for Jeremy Ray, Jeremy Ray (MRN [id number]) as of 04/25/2013 10:34  Ref. Range 04/19/2013 13:40 04/25/2013 05:31  Glucose Latest Range: 70-99 mg/dL 132 (H) 224 (H)   Diabetes history: No documented history of diabetes Outpatient Diabetes medications: NA Current orders for Inpatient glycemic control: None  Inpatient Diabetes Program Recommendations Correction (SSI): While inpatient, please consider ordering CBGs with Novolog correction ACHS. HgbA1C: Please order an A1C to evaluate glycemic control over the past 2-3 months. Diet: Please consider changing diet to carb modified.  Note: Noted patient admitted overnight post TURP on 04/24/13.  Patient does not have a documented history of diabetes. Noted recent diagnosis of pancreatic cancer in December 2014 which could be impacting glycemic control.  Fasting glucose this morning on labs was 224 mg/dl.  While inpatient, please order an A1C, CBGs with Novolog correction ACHS, and change diet to carb modified.   Thanks, Barnie Alderman, RN, MSN, CCRN Diabetes Coordinator Inpatient Diabetes Program [phone] (Team Pager) [phone] ([facility] office) [phone] [facility] office)

## 2013-04-25 NOTE — Progress Notes (Signed)
General status good b/p 134/75 temp 98.6urine clear wbc9.8 hct 35.1plan dc cbi and dc home with catheter.will see in office Monday to remove foley.resume home meds.

## 2013-04-25 NOTE — Progress Notes (Signed)
Pt OOB to chair with 2 person assist. Pt tolerated well.  Ambulated approximately 50 feet in room. No complaints. Family at bedside.

## 2013-04-25 NOTE — Anesthesia Postprocedure Evaluation (Signed)
  Anesthesia Post-op Note  Patient: Jeremy Ray  Procedure(s) Performed: Procedure(s): TRANSURETHRAL RESECTION OF THE PROSTATE (TURP) (N/A)  Patient Location:Room 315  Anesthesia Type:Spinal  Level of Consciousness: awake, alert , oriented and patient cooperative  Airway and Oxygen Therapy: Patient Spontanous Breathing  Post-op Pain: mild  Post-op Assessment: Post-op Vital signs reviewed, Patient's Cardiovascular Status Stable, Respiratory Function Stable, Patent Airway, No signs of Nausea or vomiting and Pain level controlled  Post-op Vital Signs: Reviewed and stable  Complications: No apparent anesthesia complications

## 2013-04-26 ENCOUNTER — Other Ambulatory Visit (INDEPENDENT_AMBULATORY_CARE_PROVIDER_SITE_OTHER): Payer: Self-pay | Admitting: Internal Medicine

## 2013-04-26 DIAGNOSIS — K219 Gastro-esophageal reflux disease without esophagitis: Secondary | ICD-10-CM

## 2013-04-26 MED ORDER — OMEPRAZOLE 20 MG PO CPDR: 20.0000 mg | DELAYED_RELEASE_CAPSULE | Freq: Two times a day (BID) | ORAL | Status: DC

## 2013-04-27 ENCOUNTER — Encounter (HOSPITAL_COMMUNITY): Payer: Medicare Other | Attending: Hematology and Oncology

## 2013-04-27 VITALS — BP 128/72 | HR 96 | Temp 98.1°F | Resp 20 | Wt 155.8 lb

## 2013-04-27 DIAGNOSIS — K831 Obstruction of bile duct: Secondary | ICD-10-CM

## 2013-04-27 DIAGNOSIS — N401 Enlarged prostate with lower urinary tract symptoms: Secondary | ICD-10-CM

## 2013-04-27 DIAGNOSIS — K869 Disease of pancreas, unspecified: Secondary | ICD-10-CM

## 2013-04-27 DIAGNOSIS — N138 Other obstructive and reflux uropathy: Secondary | ICD-10-CM

## 2013-04-27 DIAGNOSIS — K838 Other specified diseases of biliary tract: Secondary | ICD-10-CM

## 2013-04-27 DIAGNOSIS — C259 Malignant neoplasm of pancreas, unspecified: Secondary | ICD-10-CM

## 2013-04-27 MED ORDER — OXYCODONE HCL 5 MG PO TABS: ORAL_TABLET | ORAL | Status: DC

## 2013-04-27 MED ORDER — METOCLOPRAMIDE HCL 10 MG PO TABS: ORAL_TABLET | ORAL | Status: AC

## 2013-04-27 MED ORDER — MEGESTROL ACETATE 625 MG/5ML PO SUSP: ORAL | Status: DC

## 2013-04-27 NOTE — Patient Instructions (Signed)
[facility] Discharge Instructions  RECOMMENDATIONS MADE BY THE CONSULTANT AND ANY TEST RESULTS WILL BE SENT TO YOUR REFERRING PHYSICIAN.  EXAM FINDINGS BY THE PHYSICIAN TODAY AND SIGNS OR SYMPTOMS TO REPORT TO CLINIC OR PRIMARY PHYSICIAN: Exam and findings as discussed by Dr. Barnet Glasgow.  MEDICATIONS PRESCRIBED:   Megace; Reglan; Oxycodone  INSTRUCTIONS/FOLLOW-UP:  In addition to the newly prescribed medications, take aspirin 81mg  (one "baby" aspirin) daily. Return in one month for follow-up appointment with doctor.   Thank you for choosing [facility] to provide your oncology and hematology care.  To afford each patient quality time with our providers, please arrive at least 15 minutes before your scheduled appointment time.  With your help, our goal is to use those 15 minutes to complete the necessary work-up to ensure our physicians have the information they need to help with your evaluation and healthcare recommendations.    Effective January 1st, 2014, we ask that you re-schedule your appointment with our physicians should you arrive 10 or more minutes late for your appointment.  We strive to give you quality time with our providers, and arriving late affects you and other patients whose appointments are after yours.    Again, thank you for choosing [facility].  Our hope is that these requests will decrease the amount of time that you wait before being seen by our physicians.       _____________________________________________________________  Should you have questions after your visit to [facility], please contact our office at [phone] between the hours of 8:30 a.m. and 5:00 p.m.  Voicemails left after 4:30 p.m. will not be returned until the following business day.  For prescription refill requests, have your pharmacy contact our office with your prescription refill request.

## 2013-04-27 NOTE — Op Note (Signed)
NAME:  Jeremy Ray, Jeremy Ray NO.:  [id number]  MEDICAL RECORD NO.:  [id number]  LOCATION:                                 FACILITY:  PHYSICIAN:  Marissa Nestle, M.D.DATE OF BIRTH:  1923/08/18  DATE OF PROCEDURE:  04/24/2013 DATE OF DISCHARGE:                              OPERATIVE REPORT   PREOPERATIVE DIAGNOSES:  Acute urinary retention, benign prostatic hypertrophy.  POSTOPERATIVE DIAGNOSES:  Acute urinary retention, benign prostatic hypertrophy.  PROCEDURE:  TUR prostate.  ANESTHESIA:  Spinal.  DESCRIPTION OF PROCEDURE:  The patient under spinal anesthesia in lithotomy position.  After usual prep and drape, a #28 Iglesias resectoscope was introduced into the bladder.  Once again, the prostatic urethra was inspected.  At this time, I find out that prostrate is not that big as I previously thought that we may have to do it in 2 stages. I think it is not as big, so I proceeded with resection of the bladder neck circumferentially.  After resecting the bladder neck circumferentially down to the circular fibers, bleeders were coagulated. At this point, the resectoscope was pulled back at the level of the verumontanum, rotated to 11 o'clock position.  A groove was created from the bladder neck to the level of the verumontanum down to the capsule. Then, the right lobe was completely resected between 11 and 7 o'clock positions.  Similarly, the left lobe was resected between 1 and 5 o'clock positions.  The prostatic urethra now looks wide open.  There is some residual tissue in the lateral lobe but it is not causing any visual obstruction.  The posterior midline tissue was resected very carefully not to injure the sphincter of the verumontanum, then anterior midline tissue was resected at the end.  Prostatic urethra looks wide open.  Chips were evacuated.  Bleeders were coagulated.  Prostatic urethra looks wide open.  Resectoscope was removed and a 22  three-way Foley catheter left in for drainage.  CBI started, which is clear.  The patient left the operating room in satisfactory condition.     Marissa Nestle, M.D.     MIJ/MEDQ  D:  04/24/2013  T:  04/25/2013  Job:  [id number]

## 2013-04-27 NOTE — Progress Notes (Signed)
[facility]  OFFICE PROGRESS NOTE  L[place]ette [place], [place] [id number]  DIAGNOSIS: Pancreas cancer - Plan: CBC with Differential, Comprehensive metabolic panel, Cancer antigen 19-9  BPH (benign prostatic hypertrophy) with urinary obstruction  Obstructive jaundice, s/p biliary stent  Chief Complaint  Patient presents with  . Follow-up  . Pancreatic Cancer  . Benign prostatic hypertrophy, status post TURP    CURRENT THERAPY: Patient has decided not to undertake lab C. or chemotherapy.  INTERVAL HISTORY: Jeremy Ray 80 y.o. male returns for followup of presumed carcinoma of pancreas with obstructive jaundice, status post biliary stent most recently suffering with urinary tract obstruction, status post TURP on 04/24/2013. He has decided not to undertake any definitive treatment for his cancer. He still has a Foley catheter in place which he plans to have removed on 04/30/2013. Previously noted back and abdominal pain was relieved once he had a Foley catheter inserted and underwent TURP. He's been losing weight. He denies any nausea, vomiting, fever, night sweats, severe back pain at this time, lower extremity swelling or redness, but has been anorexia with intermittent nausea. Stools are light and dark alternately with normal colored urine now with no further hematuria.  MEDICAL HISTORY: Past Medical History  Diagnosis Date  . GERD (gastroesophageal reflux disease)   . Arthritis   . Pancreatic cancer   . BPH (benign prostatic hypertrophy)     INTERIM HISTORY: has Abnormal ECG; PAC (premature atrial contraction); Obstructive jaundice; Anemia; Heme positive stool; Pancreas cancer; and BPH (benign prostatic hypertrophy) with urinary obstruction on his problem list.    ALLERGIES:  has No Known Allergies.  MEDICATIONS: has a current medication list which includes the following prescription(s): cephalexin, metformin,  omeprazole, pancrelipase (lip-prot-amyl), tramadol, megestrol, metoclopramide, and oxycodone.  SURGICAL HISTORY:  Past Surgical History  Procedure Laterality Date  . Tonsillectomy    . Hernia repair Right     Patient states that this was 50 years ago  . Ercp N/A 03/02/2013    Procedure: ENDOSCOPIC RETROGRADE CHOLANGIOPANCREATOGRAPHY (ERCP), BRUSHINGS OF BILE DUCT;  Surgeon: Rogene Houston, MD;  Location: [facility];  Service: Endoscopy;  Laterality: N/A;  . Sphincterotomy N/A 03/02/2013    Procedure: SPHINCTEROTOMY;  Surgeon: Rogene Houston, MD;  Location: [facility];  Service: Endoscopy;  Laterality: N/A;  . Biliary stent placement N/A 03/02/2013    Procedure: BILIARY WALL STENT PLACEMENT;  Surgeon: Rogene Houston, MD;  Location: [facility];  Service: Endoscopy;  Laterality: N/A;  . Esophagogastroduodenoscopy N/A 03/02/2013    Procedure: ESOPHAGOGASTRODUODENOSCOPY (EGD);  Surgeon: Rogene Houston, MD;  Location: [facility];  Service: Endoscopy;  Laterality: N/A;  . Transurethral resection of prostate N/A 04/24/2013    Procedure: TRANSURETHRAL RESECTION OF THE PROSTATE (TURP);  Surgeon: Marissa Nestle, MD;  Location: [facility];  Service: Urology;  Laterality: N/A;    FAMILY HISTORY: family history includes Cirrhosis in his brother; Healthy in his daughter and son; Pancreatic cancer in his brother; Stroke in his father.  SOCIAL HISTORY:  reports that he quit smoking about 50 years ago. His smoking use included Cigars. He quit smokeless tobacco use about 40 years ago. His smokeless tobacco use included Chew. He reports that he does not drink alcohol or use illicit drugs.  REVIEW OF SYSTEMS:  Other than that discussed above is noncontributory.  PHYSICAL EXAMINATION: ECOG PERFORMANCE STATUS: 2 - Symptomatic, <50% confined to bed  Blood pressure  128/72, pulse 96, temperature 98.1 F (36.7 C), temperature source Oral, resp. rate 20, weight 155 lb 12.8 oz (70.67 kg).  GENERAL:alert, no distress and  comfortable SKIN: skin color, texture, turgor are normal, no rashes or significant lesions EYES: PERLA; Conjunctiva are pink and non-injected, sclera clear OROPHARYNX:no exudate, no erythema on lips, buccal mucosa, or tongue. NECK: supple, thyroid normal size, non-tender, without nodularity. No masses CHEST: Increased AP diameter with no breast masses. LYMPH:  no palpable lymphadenopathy in the cervical, axillary or inguinal LUNGS: clear to auscultation and percussion with normal breathing effort HEART: regular rate & rhythm and no murmurs. ABDOMEN:abdomen soft, non-tender and normal bowel sounds. Scaphoid with no masses. Liver and spleen not enlarged. No tenderness. MUSCULOSKELETAL:no cyanosis of digits and no clubbing. Range of motion normal.  NEURO: alert & oriented x 3 with fluent speech, no focal motor/sensory deficits GENITALIA: Foley catheter in place.   LABORATORY DATA: Admission on 04/24/2013, Discharged on 04/25/2013  Component Date Value Range Status  . Glucose-Capillary 04/24/2013 142* 70 - 99 mg/dL Final  . Specimen Description 04/24/2013 IN/OUT CATH URINE   Final  . Special Requests 04/24/2013 NONE   Final  . Culture  Setup Time 04/24/2013    Final                   Value:04/24/2013 10:40                         Performed at [facility]  . Colony Count 04/24/2013    Final                   Value:>=100,000 COLONIES/ML                         Performed at [facility]  . Culture 04/24/2013    Final                   Value:GRAM NEGATIVE RODS                         Performed at [facility]  . Report Status 04/24/2013 PENDING   Incomplete  . WBC 04/25/2013 9.9  4.0 - 10.5 K/uL Final  . RBC 04/25/2013 4.10* 4.22 - 5.81 MIL/uL Final  . Hemoglobin 04/25/2013 12.0* 13.0 - 17.0 g/dL Final  . HCT 04/25/2013 35.1* 39.0 - 52.0 % Final  . MCV 04/25/2013 85.6  78.0 - 100.0 fL Final  . MCH 04/25/2013 29.3  26.0 - 34.0 pg Final  . MCHC 04/25/2013 34.2   30.0 - 36.0 g/dL Final  . RDW 04/25/2013 13.7  11.5 - 15.5 % Final  . Platelets 04/25/2013 263  150 - 400 K/uL Final  . Sodium 04/25/2013 133* 137 - 147 mEq/L Final  . Potassium 04/25/2013 4.2  3.7 - 5.3 mEq/L Final  . Chloride 04/25/2013 96  96 - 112 mEq/L Final  . CO2 04/25/2013 28  19 - 32 mEq/L Final  . Glucose, Bld 04/25/2013 224* 70 - 99 mg/dL Final  . BUN 04/25/2013 11  6 - 23 mg/dL Final  . Creatinine, Ser 04/25/2013 0.79  0.50 - 1.35 mg/dL Final  . Calcium 04/25/2013 8.6  8.4 - 10.5 mg/dL Final  . GFR calc non Af Amer 04/25/2013 78* >90 mL/min Final  . GFR calc Af Amer 04/25/2013 >90  >90 mL/min Final   Comment: (NOTE)  The eGFR has been calculated using the CKD EPI equation.                          This calculation has not been validated in all clinical situations.                          eGFR's persistently <90 mL/min signify possible Chronic Kidney                          Disease.  Hospital Outpatient Visit on 04/19/2013  Component Date Value Range Status  . WBC 04/19/2013 6.2  4.0 - 10.5 K/uL Final  . RBC 04/19/2013 4.03* 4.22 - 5.81 MIL/uL Final  . Hemoglobin 04/19/2013 11.5* 13.0 - 17.0 g/dL Final  . HCT 04/19/2013 35.2* 39.0 - 52.0 % Final  . MCV 04/19/2013 87.3  78.0 - 100.0 fL Final  . MCH 04/19/2013 28.5  26.0 - 34.0 pg Final  . MCHC 04/19/2013 32.7  30.0 - 36.0 g/dL Final  . RDW 04/19/2013 14.3  11.5 - 15.5 % Final  . Platelets 04/19/2013 317  150 - 400 K/uL Final  . Sodium 04/19/2013 138  137 - 147 mEq/L Final  . Potassium 04/19/2013 4.9  3.7 - 5.3 mEq/L Final  . Chloride 04/19/2013 98  96 - 112 mEq/L Final  . CO2 04/19/2013 27  19 - 32 mEq/L Final  . Glucose, Bld 04/19/2013 132* 70 - 99 mg/dL Final  . BUN 04/19/2013 20  6 - 23 mg/dL Final  . Creatinine, Ser 04/19/2013 0.92  0.50 - 1.35 mg/dL Final  . Calcium 04/19/2013 9.1  8.4 - 10.5 mg/dL Final  . GFR calc non Af Amer 04/19/2013 74* >90 mL/min Final  . GFR calc Af Amer  04/19/2013 85* >90 mL/min Final   Comment: (NOTE)                          The eGFR has been calculated using the CKD EPI equation.                          This calculation has not been validated in all clinical situations.                          eGFR's persistently <90 mL/min signify possible Chronic Kidney                          Disease.  . ABO/RH(D) 04/19/2013 A POS   Final  . Antibody Screen 04/19/2013 NEG   Final  . Sample Expiration 04/19/2013 05/03/2013   Final  Admission on 04/06/2013, Discharged on 04/06/2013  Component Date Value Range Status  . Color, Urine 04/06/2013 RED* YELLOW Final   BIOCHEMICALS MAY BE AFFECTED BY COLOR  . APPearance 04/06/2013 TURBID* CLEAR Final  . Specific Gravity, Urine 04/06/2013 1.020  1.005 - 1.030 Final  . pH 04/06/2013 6.5  5.0 - 8.0 Final  . Glucose, UA 04/06/2013 100* NEGATIVE mg/dL Final  . Hgb urine dipstick 04/06/2013 LARGE* NEGATIVE Final  . Bilirubin Urine 04/06/2013 SMALL* NEGATIVE Final  . Ketones, ur 04/06/2013 NEGATIVE  NEGATIVE mg/dL Final  . Protein, ur 04/06/2013 >300* NEGATIVE mg/dL Final  . Urobilinogen, UA 04/06/2013 1.0  0.0 - 1.0 mg/dL Final  .  Nitrite 04/06/2013 POSITIVE* NEGATIVE Final  . Leukocytes, UA 04/06/2013 TRACE* NEGATIVE Final  . RBC / HPF 04/06/2013 TOO NUMEROUS TO COUNT  <3 RBC/hpf Final    PATHOLOGY: None  Urinalysis    Component Value Date/Time   COLORURINE RED* 04/06/2013 1442   APPEARANCEUR TURBID* 04/06/2013 1442   LABSPEC 1.020 04/06/2013 1442   PHURINE 6.5 04/06/2013 1442   GLUCOSEU 100* 04/06/2013 1442   HGBUR LARGE* 04/06/2013 1442   BILIRUBINUR SMALL* 04/06/2013 1442   KETONESUR NEGATIVE 04/06/2013 1442   PROTEINUR >300* 04/06/2013 1442   UROBILINOGEN 1.0 04/06/2013 1442   NITRITE POSITIVE* 04/06/2013 1442   LEUKOCYTESUR TRACE* 04/06/2013 1442    RADIOGRAPHIC STUDIES: US Scrotum  04/06/2013   CLINICAL DATA:  Acute urinary retention, history of hydrocele as well as benign prostatic hypertrophy  and pancreatic malignancy  EXAM: ULTRASOUND OF SCROTUM  TECHNIQUE: Complete ultrasound examination of the testicles, epididymis, and other scrotal structures was performed.  COMPARISON:  None.  FINDINGS: Right testicle  Measurements: 5.1 x 2.1 x 3.1 the echotexture of the right testicle is somewhat heterogeneous. No discrete mass is demonstrated. Vascularity of the right testicle is normal. No microlithiasis is demonstrated.  Left testicle  Measurements: 4.2 x 2.9 x 3.3 cm. There is mass effect upon the left testicle due to the large left-sided hydrocele. No mass or microlithiasis visualized.  Right epididymis:  Not well demonstrated.  Left epididymis:  Not well demonstrated.  There may be of  Hydrocele: There is a large left-sided hydrocele containing septations. It measures 10.3 x 8.6 x 6.8 cm.  Varicocele:  None visualized.  IMPRESSION: 1. There is no evidence of an intratesticular mass or testicular torsion. 2. There is a tiny epididymal cyst on the left. The epididymis on the right is not well evaluated. 3. There is a large, left-sided, septated hydrocele measuring at least 10.3 cm in greatest dimension. No varicocele is demonstrated.   Electronically Signed   By: David  Martinique   On: 04/06/2013 13:57    ASSESSMENT: #1. 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 #2. Benign prostatic hypertrophy, status post TURP with Foley catheter still in place. #3. Intermittent nausea with weight loss. #4. Gastroesophageal reflux disease.   PLAN:  #1. Patient does not wish to be referred to hospice at this time. #2. Patient was told to expect the development of pain for which he will be given analgesics today along with nausea and anorexia resulting in further weight loss. These will be allayed by the use of megestrol acetate to increase   appetite and metoclopramide to facilitate eating. If analgesic requirement begins to increase,  patient will be referred for nerve block. #3. He was told to call should he decide to enroll in hospice. #4. In order to allay potential for blood clots, he was told to take 81 mg of aspirin daily. #4. Followup is ordered for 4 weeks.   All questions were answered. The patient knows to call the clinic with any problems, questions or concerns. We can certainly see the patient much sooner if necessary.   I spent 25 minutes counseling the patient face to face. The total time spent in the appointment was 30 minutes.    Doroteo Bradford, MD 04/27/2013 2:42 PM

## 2013-04-28 LAB — URINE CULTURE: Colony Count: >=100000

## 2013-04-30 ENCOUNTER — Other Ambulatory Visit (HOSPITAL_COMMUNITY): Payer: Self-pay | Admitting: Hematology and Oncology

## 2013-04-30 ENCOUNTER — Telehealth (HOSPITAL_COMMUNITY): Payer: Self-pay | Admitting: Hematology and Oncology

## 2013-04-30 MED ORDER — CYPROHEPTADINE HCL 4 MG PO TABS: ORAL_TABLET | ORAL | Status: DC

## 2013-05-10 NOTE — Discharge Summary (Signed)
NAME:  [name], [name] NO.:  [id number]  MEDICAL RECORD NO.:  [id number]  LOCATION:                                 FACILITY:  PHYSICIAN:  Marissa Nestle, M.D.DATE OF BIRTH:  05/06/1923  DATE OF ADMISSION:  04/24/2013 DATE OF DISCHARGE:  02/04/2015LH                              DISCHARGE SUMMARY   HISTORY OF PRESENT ILLNESS:  Mr. Jeremy Ray is an 80 year old gentleman came to see me.  He was in a lot of symptoms of prostatism, was found to have distended bladder, so we put a Foley catheter in the office, removed about 700 mL of urine from his bladder.  He was sent home with Foley catheter.  Subsequently, I did a cystoscopy and he was found to have enlarged prostate with bladder neck obstruction.  I advised him to undergo TUR of prostate.  He agreed.  He was scheduled to have a TUR of prostate as outpatient.  Routine preadmission workup was done.  His BMED was showing sodium 138, potassium 4.9, chloride 98, CO2 is 27, BUN is 20, creatinine 0.92.  His WBC count is 6.2, hematocrit was 35.2.  He was taken to the operating room.  A TUR of prostate was done.  Postop course was benign.  He remained afebrile.  Next day, his urine is clear.  He is up and walking around with regular diet.  So at this point, I decided to send him home with Foley catheter, which I will remove in the office. It should be noted that the patient was diagnosed with pancreatic cancer during the week of Christmas of 2014.  The pancreatic cancer was not causing him any problem.  He decided to not have any surgery because of his age.  He really never had any medical problem in the past.  He seemed to be in good shape for his age.  Anyway, I sent him home with a Foley catheter.  His pathology report is not back yet.  I will remove his catheter when he comes to the office.  FINAL DISCHARGE DIAGNOSES:  Benign prostatic hypertrophy with acute urinary retention.  DISCHARGE CONDITION:   Improved.  DISCHARGE MEDICATION:  I did not give him any medicines.     Marissa Nestle, M.D.     MIJ/MEDQ  D:  05/09/2013  T:  05/10/2013  Job:  [id number]

## 2013-05-14 ENCOUNTER — Ambulatory Visit (INDEPENDENT_AMBULATORY_CARE_PROVIDER_SITE_OTHER): Payer: Medicare Other | Admitting: Internal Medicine

## 2013-05-24 ENCOUNTER — Encounter (INDEPENDENT_AMBULATORY_CARE_PROVIDER_SITE_OTHER): Payer: Self-pay | Admitting: Internal Medicine

## 2013-05-24 ENCOUNTER — Ambulatory Visit (INDEPENDENT_AMBULATORY_CARE_PROVIDER_SITE_OTHER): Payer: Medicare Other | Admitting: Internal Medicine

## 2013-05-24 VITALS — BP 110/72 | HR 68 | Temp 97.4°F | Resp 18 | Ht 69.0 in | Wt 136.7 lb

## 2013-05-24 DIAGNOSIS — E119 Type 2 diabetes mellitus without complications: Secondary | ICD-10-CM | POA: Insufficient documentation

## 2013-05-24 DIAGNOSIS — R634 Abnormal weight loss: Secondary | ICD-10-CM

## 2013-05-24 DIAGNOSIS — C259 Malignant neoplasm of pancreas, unspecified: Secondary | ICD-10-CM

## 2013-05-24 DIAGNOSIS — K219 Gastro-esophageal reflux disease without esophagitis: Secondary | ICD-10-CM

## 2013-05-24 MED ORDER — PANTOPRAZOLE SODIUM 40 MG PO TBEC: 40.0000 mg | DELAYED_RELEASE_TABLET | Freq: Every day | ORAL | Status: DC

## 2013-05-24 NOTE — Patient Instructions (Signed)
Physician will contact her with results of blood work when completed 

## 2013-05-24 NOTE — Progress Notes (Signed)
Presenting complaint;  Followup for GERD and pancreatic carcinoma.  Database;  Patient is 80 year old Caucasian male who presented with painless jaundice in December 2014 with painless jaundice and pancreatic head mass. CA 19-9 was over 17,000. He underwent ERCP with placement of Wallstent and resolution of his obstructive jaundice. Brushing cytology was negative. Patient was also felt to have EPI and begun on pancreatic supplement. He was also noted to have ulcerative reflux esophagitis at the time of ERCP. Patient was referred to Dr. Farrel Gobble. He decided not to undergo further therapy. One month ago he underwent TURP for obstructive uropathy by Dr. Michela Pitcher.  Subjective:  Patient has no complaints. He has lost 15 pounds in the last 3 months. His appetite has improved since he was begun on Megace 3 weeks ago but it is still not normal. He states his weight loss is not slowing him in any way. Heartburn is well controlled with therapy but he is having hard time getting his insurance to pay for omeprazole. He denies dysphagia nausea vomiting abdominal pain diarrhea melena or rectal bleeding. He usually has one formed stool daily. He has noted intermittent hematuria since his TURP one month ago but he does not have dysuria or difficulty walking. He said his fasting blood glucose runs around 150.   Current Medications: Current Outpatient Prescriptions  Medication Sig Dispense Refill  . aspirin EC 81 MG tablet Take 81 mg by mouth daily.      . Megestrol Acetate (MEGACE ES PO) Take by mouth. 1 teaspoon by mouth every morning      . metFORMIN (GLUCOPHAGE) 500 MG tablet       . metoCLOPramide (REGLAN) 10 MG tablet Take one tablet 4 times a day before meals and at bedtime.  120 tablet  4  . omeprazole (PRILOSEC) 20 MG capsule Take 1 capsule (20 mg total) by mouth 2 (two) times daily before a meal.  60 capsule  5  . Pancrelipase, Lip-Prot-Amyl, 24000 UNITS CPEP Take 2 capsules each meal and one  with snack.  240 capsule  5  . solifenacin (VESICARE) 5 MG tablet Take 5 mg by mouth daily.      . traMADol (ULTRAM) 50 MG tablet Take 50 mg by mouth every 4 (four) hours as needed for moderate pain.        No current facility-administered medications for this visit.     Objective: Blood pressure 110/72, pulse 68, temperature 97.4 F (36.3 C), temperature source Oral, resp. rate 18, height 5\' 9"  (1.753 m), weight 136 lb 11.2 oz (62.007 kg). Patient is alert and in no acute distress. Conjunctiva is pink. Sclera is nonicteric Oropharyngeal mucosa somewhat dry otherwise normal. No neck masses or thyromegaly noted. Cardiac exam with regular rhythm normal S1 and S2. No murmur or gallop noted. Lungs are clear to auscultation. Abdomen symmetrical soft and nontender without organomegaly or masses.  No LE edema or clubbing noted.  Labs/studies Results: LFTs from 03/26/2013. Bilirubin 2.6, AP 245, AST 31, ALT 28 and albumin 3.0.   Assessment:  #1. Obstructive jaundice secondary to pancreatic carcinoma treated with biliary stenting on 03/02/2013 with resolution of jaundice. Patient remains pain-free but he is losing 4-5 pounds every month. Weight loss may be secondary to poorly controlled diabetes mellitus. Patient has opted not to undergo therapy; therefore histologic confirmation of diagnosis not to made. I would if the recently approved Necuparanib has any advantage compared to standard treatment options. #2. Weight loss. Weight loss may be secondary to poorly  controlled diabetes. 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 #3. Ulcerative reflux esophagitis. He is doing better with therapy. Will switch him to pantoprazole as omeprazole is not covered.  Plan:  Patient will have HbA1c with next blood draw. Discontinue omeprazole. Begin pantoprazole 40 mg by mouth every morning. Office visit in 3 months. Should patient develop abdominal pain  or jaundice he will call office.

## 2013-05-25 ENCOUNTER — Encounter (HOSPITAL_COMMUNITY): Payer: Medicare Other | Attending: Hematology and Oncology

## 2013-05-25 ENCOUNTER — Encounter (HOSPITAL_COMMUNITY): Payer: Self-pay

## 2013-05-25 VITALS — BP 152/88 | HR 88 | Temp 98.6°F | Resp 18 | Wt 134.7 lb

## 2013-05-25 DIAGNOSIS — N139 Obstructive and reflux uropathy, unspecified: Secondary | ICD-10-CM

## 2013-05-25 DIAGNOSIS — R634 Abnormal weight loss: Secondary | ICD-10-CM

## 2013-05-25 DIAGNOSIS — N401 Enlarged prostate with lower urinary tract symptoms: Secondary | ICD-10-CM

## 2013-05-25 DIAGNOSIS — C250 Malignant neoplasm of head of pancreas: Secondary | ICD-10-CM

## 2013-05-25 DIAGNOSIS — C259 Malignant neoplasm of pancreas, unspecified: Secondary | ICD-10-CM | POA: Insufficient documentation

## 2013-05-25 DIAGNOSIS — E119 Type 2 diabetes mellitus without complications: Secondary | ICD-10-CM

## 2013-05-25 DIAGNOSIS — N138 Other obstructive and reflux uropathy: Secondary | ICD-10-CM | POA: Insufficient documentation

## 2013-05-25 DIAGNOSIS — R110 Nausea: Secondary | ICD-10-CM

## 2013-05-25 LAB — COMPREHENSIVE METABOLIC PANEL
ALBUMIN: 3.7 g/dL (ref 3.5–5.2)
ALK PHOS: 156 U/L — AB (ref 39–117)
ALT: 13 U/L (ref 0–53)
AST: 20 U/L (ref 0–37)
BUN: 33 mg/dL — AB (ref 6–23)
CO2: 21 mEq/L (ref 19–32)
Calcium: 9.5 mg/dL (ref 8.4–10.5)
Chloride: 98 mEq/L (ref 96–112)
Creatinine, Ser: 1.22 mg/dL (ref 0.50–1.35)
GFR calc non Af Amer: 51 mL/min — ABNORMAL LOW (ref >90)
GFR, EST AFRICAN AMERICAN: 60 mL/min — AB (ref >90)
Glucose, Bld: 182 mg/dL — ABNORMAL HIGH (ref 70–99)
POTASSIUM: 4.3 meq/L (ref 3.7–5.3)
Sodium: 134 mEq/L — ABNORMAL LOW (ref 137–147)
Total Bilirubin: 1 mg/dL (ref 0.3–1.2)
Total Protein: 7.7 g/dL (ref 6.0–8.3)

## 2013-05-25 LAB — CBC WITH DIFFERENTIAL/PLATELET
Basophils Absolute: 0.1 10*3/uL (ref 0.0–0.1)
Basophils Relative: 1 % (ref 0–1)
Eosinophils Absolute: 0.3 10*3/uL (ref 0.0–0.7)
Eosinophils Relative: 3 % (ref 0–5)
HCT: 35.9 % — ABNORMAL LOW (ref 39.0–52.0)
Hemoglobin: 12.3 g/dL — ABNORMAL LOW (ref 13.0–17.0)
Lymphocytes Relative: 11 % — ABNORMAL LOW (ref 12–46)
Lymphs Abs: 1.2 10*3/uL (ref 0.7–4.0)
MCH: 28.3 pg (ref 26.0–34.0)
MCHC: 34.3 g/dL (ref 30.0–36.0)
MCV: 82.7 fL (ref 78.0–100.0)
Monocytes Absolute: 0.7 10*3/uL (ref 0.1–1.0)
Monocytes Relative: 7 % (ref 3–12)
Neutro Abs: 8.3 10*3/uL — ABNORMAL HIGH (ref 1.7–7.7)
Neutrophils Relative %: 79 % — ABNORMAL HIGH (ref 43–77)
Platelets: 315 10*3/uL (ref 150–400)
RBC: 4.34 MIL/uL (ref 4.22–5.81)
RDW: 14.2 % (ref 11.5–15.5)
WBC: 10.6 10*3/uL — ABNORMAL HIGH (ref 4.0–10.5)

## 2013-05-25 LAB — HEMOGLOBIN A1C
HEMOGLOBIN A1C: 8.1 % — AB (ref <5.7)
MEAN PLASMA GLUCOSE: 186 mg/dL — AB (ref <117)

## 2013-05-25 MED ORDER — TRAMADOL HCL 50 MG PO TABS: 50.0000 mg | ORAL_TABLET | ORAL | Status: AC | PRN

## 2013-05-25 NOTE — Patient Instructions (Signed)
[facility] Discharge Instructions  RECOMMENDATIONS MADE BY THE CONSULTANT AND ANY TEST RESULTS WILL BE SENT TO YOUR REFERRING PHYSICIAN.  We will see you in 1 month. We will send a prescription to your pharmacy for tramadol. We will send a referral to hospice.  They will call you to set everything up.    Thank you for choosing [facility] to provide your oncology and hematology care.  To afford each patient quality time with our providers, please arrive at least 15 minutes before your scheduled appointment time.  With your help, our goal is to use those 15 minutes to complete the necessary work-up to ensure our physicians have the information they need to help with your evaluation and healthcare recommendations.    Effective January 1st, 2014, we ask that you re-schedule your appointment with our physicians should you arrive 10 or more minutes late for your appointment.  We strive to give you quality time with our providers, and arriving late affects you and other patients whose appointments are after yours.    Again, thank you for choosing [facility].  Our hope is that these requests will decrease the amount of time that you wait before being seen by our physicians.       _____________________________________________________________  Should you have questions after your visit to [facility], please contact our office at [phone] between the hours of 8:30 a.m. and 5:00 p.m.  Voicemails left after 4:30 p.m. will not be returned until the following business day.  For prescription refill requests, have your pharmacy contact our office with your prescription refill request.

## 2013-05-25 NOTE — Progress Notes (Signed)
[facility]  OFFICE PROGRESS NOTE  Lanette Hampshire, MD [place] [address]  DIAGNOSIS: Pancreas cancer - Plan: CBC with Differential, Comprehensive metabolic panel, Cancer antigen 19-9, Ambulatory referral to Hospice  BPH (benign prostatic hypertrophy) with urinary obstruction - Plan: Ambulatory referral to Hospice  DM (diabetes mellitus) - Plan: Ambulatory referral to Hospice  Chief Complaint  Patient presents with  . Presumed pancreatic cancer    CURRENT THERAPY: Supportive care only, refused pancreatic biopsy or chemotherapy. Status post biliary stent for obstructive jaundice with resolution of that symptom.  INTERVAL HISTORY: Jeremy Ray 80 y.o. male returns for followup of presumed pancreatic cancer with a hepatobiliary obstruction, status post biliary stent with relief of jaundice but with persistent weight loss despite Megace.  Megace has increased his desire to E. but unfortunately has not gained weight. He denies a diarrhea, melena, hematochezia, epistaxis, or hemoptysis. Previously he underwent TURP with some hematuria which has cleared. He no longer has a Foley catheter. He is desirous of having a cataract removed with which I have no objection. He denies any fever, night sweats, cough, wheezing, increasing weakness, skin rash, joint pain, headache, lower extremity swelling or redness, PND, orthopnea, palpitations, headache, or seizures.  MEDICAL HISTORY: Past Medical History  Diagnosis Date  . GERD (gastroesophageal reflux disease)   . Arthritis   . Pancreatic cancer   . BPH (benign prostatic hypertrophy)     INTERIM HISTORY: has Abnormal ECG; PAC (premature atrial contraction); Obstructive jaundice; Anemia; Heme positive stool; Pancreas cancer; BPH (benign prostatic hypertrophy) with urinary obstruction; and DM (diabetes mellitus) on his problem list.    ALLERGIES:  has No Known  Allergies.  MEDICATIONS: has a current medication list which includes the following prescription(s): aspirin ec, megestrol acetate, metformin, metoclopramide, pancrelipase (lip-prot-amyl), pantoprazole, solifenacin, and tramadol.  SURGICAL HISTORY:  Past Surgical History  Procedure Laterality Date  . Tonsillectomy    . Hernia repair Right     Patient states that this was 50 years ago  . Ercp N/A 03/02/2013    Procedure: ENDOSCOPIC RETROGRADE CHOLANGIOPANCREATOGRAPHY (ERCP), BRUSHINGS OF BILE DUCT;  Surgeon: Rogene Houston, MD;  Location: [facility];  Service: Endoscopy;  Laterality: N/A;  . Sphincterotomy N/A 03/02/2013    Procedure: SPHINCTEROTOMY;  Surgeon: Rogene Houston, MD;  Location: [facility];  Service: Endoscopy;  Laterality: N/A;  . Biliary stent placement N/A 03/02/2013    Procedure: BILIARY WALL STENT PLACEMENT;  Surgeon: Rogene Houston, MD;  Location: [facility];  Service: Endoscopy;  Laterality: N/A;  . Esophagogastroduodenoscopy N/A 03/02/2013    Procedure: ESOPHAGOGASTRODUODENOSCOPY (EGD);  Surgeon: Rogene Houston, MD;  Location: [facility];  Service: Endoscopy;  Laterality: N/A;  . Transurethral resection of prostate N/A 04/24/2013    Procedure: TRANSURETHRAL RESECTION OF THE PROSTATE (TURP);  Surgeon: Marissa Nestle, MD;  Location: [facility];  Service: Urology;  Laterality: N/A;    FAMILY HISTORY: family history includes Cirrhosis in his brother; Healthy in his daughter and son; Pancreatic cancer in his brother; Stroke in his father.  SOCIAL HISTORY:  reports that he quit smoking about 50 years ago. His smoking use included Cigars. He quit smokeless tobacco use about 40 years ago. His smokeless tobacco use included Chew. He reports that he does not drink alcohol or use illicit drugs.  REVIEW OF SYSTEMS:  Other than that discussed above is noncontributory.  PHYSICAL EXAMINATION: ECOG PERFORMANCE STATUS: 1 - Symptomatic  but completely ambulatory  Blood pressure 152/88, pulse 88,  temperature 98.6 F (37 C), temperature source Oral, resp. rate 18, weight 134 lb 11.2 oz (61.1 kg), SpO2 99.00%.  GENERAL:alert, no distress and comfortable. Cachectic in appearance. SKIN: skin color, texture, turgor are normal, no rashes or significant lesions EYES: PERLA; no jaundice. Left mature cataract. OROPHARYNX:no exudate, no erythema on lips, buccal mucosa, or tongue. NECK: supple, thyroid normal size, non-tender, without nodularity. No masses CHEST: Normal AP diameter with no breast masses. LYMPH:  no palpable lymphadenopathy in the cervical, axillary or inguinal LUNGS: clear to auscultation and percussion with normal breathing effort HEART: regular rate & rhythm and no murmurs. ABDOMEN:abdomen soft, non-tender and normal bowel sounds. Scaphoid with no masses or organomegaly. MUSCULOSKELETAL:no cyanosis of digits and no clubbing. Range of motion normal.  NEURO: alert & oriented x 3 with fluent speech, no focal motor/sensory deficits   LABORATORY DATA: Office Visit on 05/25/2013  Component Date Value Ref Range Status  . WBC 05/25/2013 10.6* 4.0 - 10.5 K/uL Final  . RBC 05/25/2013 4.34  4.22 - 5.81 MIL/uL Final  . Hemoglobin 05/25/2013 12.3* 13.0 - 17.0 g/dL Final  . HCT 05/25/2013 35.9* 39.0 - 52.0 % Final  . MCV 05/25/2013 82.7  78.0 - 100.0 fL Final  . MCH 05/25/2013 28.3  26.0 - 34.0 pg Final  . MCHC 05/25/2013 34.3  30.0 - 36.0 g/dL Final  . RDW 05/25/2013 14.2  11.5 - 15.5 % Final  . Platelets 05/25/2013 315  150 - 400 K/uL Final  . Neutrophils Relative % 05/25/2013 79* 43 - 77 % Final  . Neutro Abs 05/25/2013 8.3* 1.7 - 7.7 K/uL Final  . Lymphocytes Relative 05/25/2013 11* 12 - 46 % Final  . Lymphs Abs 05/25/2013 1.2  0.7 - 4.0 K/uL Final  . Monocytes Relative 05/25/2013 7  3 - 12 % Final  . Monocytes Absolute 05/25/2013 0.7  0.1 - 1.0 K/uL Final  . Eosinophils Relative 05/25/2013 3  0 - 5 % Final  . Eosinophils Absolute 05/25/2013 0.3  0.0 - 0.7 K/uL Final  .  Basophils Relative 05/25/2013 1  0 - 1 % Final  . Basophils Absolute 05/25/2013 0.1  0.0 - 0.1 K/uL Final  . Sodium 05/25/2013 134* 137 - 147 mEq/L Final  . Potassium 05/25/2013 4.3  3.7 - 5.3 mEq/L Final  . Chloride 05/25/2013 98  96 - 112 mEq/L Final  . CO2 05/25/2013 21  19 - 32 mEq/L Final  . Glucose, Bld 05/25/2013 182* 70 - 99 mg/dL Final  . BUN 05/25/2013 33* 6 - 23 mg/dL Final  . Creatinine, Ser 05/25/2013 1.22  0.50 - 1.35 mg/dL Final  . Calcium 05/25/2013 9.5  8.4 - 10.5 mg/dL Final  . Total Protein 05/25/2013 7.7  6.0 - 8.3 g/dL Final  . Albumin 05/25/2013 3.7  3.5 - 5.2 g/dL Final  . AST 05/25/2013 20  0 - 37 U/L Final  . ALT 05/25/2013 13  0 - 53 U/L Final  . Alkaline Phosphatase 05/25/2013 156* 39 - 117 U/L Final  . Total Bilirubin 05/25/2013 1.0  0.3 - 1.2 mg/dL Final  . GFR calc non Af Amer 05/25/2013 51* >90 mL/min Final  . GFR calc Af Amer 05/25/2013 60* >90 mL/min Final   Comment: (NOTE)                          The eGFR has been calculated using the CKD EPI equation.  This calculation has not been validated in all clinical situations.                          eGFR's persistently <90 mL/min signify possible Chronic Kidney                          Disease.    PATHOLOGY: Patient refused biopsy.  Urinalysis    Component Value Date/Time   COLORURINE RED* 04/06/2013 1442   APPEARANCEUR TURBID* 04/06/2013 1442   LABSPEC 1.020 04/06/2013 1442   PHURINE 6.5 04/06/2013 1442   GLUCOSEU 100* 04/06/2013 1442   HGBUR LARGE* 04/06/2013 1442   BILIRUBINUR SMALL* 04/06/2013 1442   KETONESUR NEGATIVE 04/06/2013 1442   PROTEINUR >300* 04/06/2013 1442   UROBILINOGEN 1.0 04/06/2013 1442   NITRITE POSITIVE* 04/06/2013 1442   LEUKOCYTESUR TRACE* 04/06/2013 1442    RADIOGRAPHIC STUDIES: No results found.  ASSESSMENT:  #1. 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 #2. Benign prostatic hypertrophy, status post TURP.  #3. Intermittent nausea with weight loss.  #4. Gastroesophageal reflux disease, on pantoprazole along with pancreatic enzymes.   PLAN:  #1. Patient and his wife expressed desire to be evaluated by hospice which was ordered today. #2. No hematologic or oncologic contraindication to undergoing cataract surgery. #3. Continue current medications. #4. Followup in 4 weeks. CBC, chem profile at that time. Patient did ask what to expect in the future. He was told that he is slowly dying from inanition and may develop pain which can be controlled with stronger oral analgesics or by nerve block. I agree 100% with hospice referral at this time to provide not only palliative but also psychosocial support for the patient as well as his wife.   All questions were answered. The patient knows to call the clinic with any problems, questions or concerns. We can certainly see the patient much sooner if necessary.   I spent 25 minutes counseling the patient face to face. The total time spent in the appointment was 30 minutes.    Doroteo Bradford, MD 05/25/2013 1:01 PM

## 2013-05-26 LAB — CANCER ANTIGEN 19-9: CA 19-9: 63710.8 U/mL — ABNORMAL HIGH (ref <35.0)

## 2013-05-29 ENCOUNTER — Other Ambulatory Visit (HOSPITAL_COMMUNITY): Payer: Self-pay | Admitting: Oncology

## 2013-05-29 DIAGNOSIS — C259 Malignant neoplasm of pancreas, unspecified: Secondary | ICD-10-CM

## 2013-05-29 MED ORDER — OXYCODONE HCL 5 MG PO TABS: 5.0000 mg | ORAL_TABLET | ORAL | Status: AC | PRN

## 2013-06-26 ENCOUNTER — Encounter (HOSPITAL_COMMUNITY): Payer: Medicare Other | Attending: Hematology and Oncology

## 2013-06-26 ENCOUNTER — Encounter (HOSPITAL_COMMUNITY): Payer: Self-pay

## 2013-06-26 ENCOUNTER — Encounter (HOSPITAL_BASED_OUTPATIENT_CLINIC_OR_DEPARTMENT_OTHER): Payer: Medicare Other

## 2013-06-26 VITALS — BP 106/62 | HR 77 | Temp 97.3°F | Resp 16 | Wt 125.2 lb

## 2013-06-26 DIAGNOSIS — E119 Type 2 diabetes mellitus without complications: Secondary | ICD-10-CM | POA: Insufficient documentation

## 2013-06-26 DIAGNOSIS — N138 Other obstructive and reflux uropathy: Secondary | ICD-10-CM

## 2013-06-26 DIAGNOSIS — C259 Malignant neoplasm of pancreas, unspecified: Secondary | ICD-10-CM

## 2013-06-26 DIAGNOSIS — N401 Enlarged prostate with lower urinary tract symptoms: Secondary | ICD-10-CM

## 2013-06-26 DIAGNOSIS — C250 Malignant neoplasm of head of pancreas: Secondary | ICD-10-CM

## 2013-06-26 LAB — COMPREHENSIVE METABOLIC PANEL
ALBUMIN: 2.9 g/dL — AB (ref 3.5–5.2)
ALK PHOS: 199 U/L — AB (ref 39–117)
ALT: 18 U/L (ref 0–53)
AST: 19 U/L (ref 0–37)
BUN: 35 mg/dL — AB (ref 6–23)
CO2: 27 mEq/L (ref 19–32)
Calcium: 8.8 mg/dL (ref 8.4–10.5)
Chloride: 96 mEq/L (ref 96–112)
Creatinine, Ser: 1.01 mg/dL (ref 0.50–1.35)
GFR calc Af Amer: 75 mL/min — ABNORMAL LOW (ref >90)
GFR calc non Af Amer: 65 mL/min — ABNORMAL LOW (ref >90)
Glucose, Bld: 258 mg/dL — ABNORMAL HIGH (ref 70–99)
POTASSIUM: 4.2 meq/L (ref 3.7–5.3)
SODIUM: 135 meq/L — AB (ref 137–147)
Total Bilirubin: 0.9 mg/dL (ref 0.3–1.2)
Total Protein: 6.7 g/dL (ref 6.0–8.3)

## 2013-06-26 LAB — CBC WITH DIFFERENTIAL/PLATELET
BASOS PCT: 0 % (ref 0–1)
Basophils Absolute: 0 10*3/uL (ref 0.0–0.1)
Eosinophils Absolute: 0.1 10*3/uL (ref 0.0–0.7)
Eosinophils Relative: 1 % (ref 0–5)
HCT: 32.3 % — ABNORMAL LOW (ref 39.0–52.0)
Hemoglobin: 10.7 g/dL — ABNORMAL LOW (ref 13.0–17.0)
Lymphocytes Relative: 8 % — ABNORMAL LOW (ref 12–46)
Lymphs Abs: 1 10*3/uL (ref 0.7–4.0)
MCH: 28.2 pg (ref 26.0–34.0)
MCHC: 33.1 g/dL (ref 30.0–36.0)
MCV: 85.2 fL (ref 78.0–100.0)
Monocytes Absolute: 0.4 10*3/uL (ref 0.1–1.0)
Monocytes Relative: 3 % (ref 3–12)
NEUTROS PCT: 88 % — AB (ref 43–77)
Neutro Abs: 11.4 10*3/uL — ABNORMAL HIGH (ref 1.7–7.7)
PLATELETS: 313 10*3/uL (ref 150–400)
RBC: 3.79 MIL/uL — ABNORMAL LOW (ref 4.22–5.81)
RDW: 17.1 % — ABNORMAL HIGH (ref 11.5–15.5)
WBC: 13 10*3/uL — ABNORMAL HIGH (ref 4.0–10.5)

## 2013-06-26 NOTE — Progress Notes (Signed)
[facility]  OFFICE PROGRESS NOTE  Lanette Hampshire, MD [place] [address]  DIAGNOSIS: Pancreas cancer - Plan: CBC with Differential, Comprehensive metabolic panel  BPH (benign prostatic hypertrophy) with urinary obstruction  DM (diabetes mellitus) - Plan: Comprehensive metabolic panel  Chief Complaint  Patient presents with  . Presumed cancer of the pancreas, not biopsy    Refused active therapy    CURRENT THERAPY: No active treatment, presumed cancer of the pancreas, refused biopsy or chemotherapy, status post biliary stent for obstructive jaundice with resolution of that symptom. On pancreatic enzyme supplements, metoclopramide, Megace, and Protonix.  INTERVAL HISTORY: Jeremy Ray 80 y.o. male returns for followup of presumed carcinoma of pancreas with extrahepatic biliary obstruction, status post stenting, refusing biopsy and any anticancer therapy. He had undergone TURP on 04/24/2013. He was enrolled in hospice 1 month ago. He still has intermittent hematuria and does have a followup appointment with his urologist later this week. He denies any nausea or vomiting and pain is controlled well with tramadol around the clock. He denies any diarrhea, constipation, melena, hematochezia, hematuria, darkened urine, light stools, fever, night sweats, abdominal distention, or back pain while lying flat.  MEDICAL HISTORY: Past Medical History  Diagnosis Date  . GERD (gastroesophageal reflux disease)   . Arthritis   . Pancreatic cancer   . BPH (benign prostatic hypertrophy)     INTERIM HISTORY: has Abnormal ECG; PAC (premature atrial contraction); Obstructive jaundice; Anemia; Heme positive stool; Pancreas cancer; BPH (benign prostatic hypertrophy) with urinary obstruction; and DM (diabetes mellitus) on his problem list.    ALLERGIES:  has No Known Allergies.  MEDICATIONS: has a current medication list which includes  the following prescription(s): aspirin ec, metformin, metoclopramide, oxycodone, pancrelipase (lip-prot-amyl), pantoprazole, solifenacin, tramadol, and megestrol acetate.  SURGICAL HISTORY:  Past Surgical History  Procedure Laterality Date  . Tonsillectomy    . Hernia repair Right     Patient states that this was 50 years ago  . Ercp N/A 03/02/2013    Procedure: ENDOSCOPIC RETROGRADE CHOLANGIOPANCREATOGRAPHY (ERCP), BRUSHINGS OF BILE DUCT;  Surgeon: Rogene Houston, MD;  Location: [facility];  Service: Endoscopy;  Laterality: N/A;  . Sphincterotomy N/A 03/02/2013    Procedure: SPHINCTEROTOMY;  Surgeon: Rogene Houston, MD;  Location: [facility];  Service: Endoscopy;  Laterality: N/A;  . Biliary stent placement N/A 03/02/2013    Procedure: BILIARY WALL STENT PLACEMENT;  Surgeon: Rogene Houston, MD;  Location: [facility];  Service: Endoscopy;  Laterality: N/A;  . Esophagogastroduodenoscopy N/A 03/02/2013    Procedure: ESOPHAGOGASTRODUODENOSCOPY (EGD);  Surgeon: Rogene Houston, MD;  Location: [facility];  Service: Endoscopy;  Laterality: N/A;  . Transurethral resection of prostate N/A 04/24/2013    Procedure: TRANSURETHRAL RESECTION OF THE PROSTATE (TURP);  Surgeon: Marissa Nestle, MD;  Location: [facility];  Service: Urology;  Laterality: N/A;    FAMILY HISTORY: family history includes Cirrhosis in his brother; Healthy in his daughter and son; Pancreatic cancer in his brother; Stroke in his father.  SOCIAL HISTORY:  reports that he quit smoking about 50 years ago. His smoking use included Cigars. He quit smokeless tobacco use about 40 years ago. His smokeless tobacco use included Chew. He reports that he does not drink alcohol or use illicit drugs.  REVIEW OF SYSTEMS:  Other than that discussed above is noncontributory.  PHYSICAL EXAMINATION: ECOG PERFORMANCE STATUS: 2 - Symptomatic, <50% confined to bed  Blood  pressure 106/62, pulse 77, temperature 97.3 F (36.3 C), temperature source Oral, resp. rate  16, weight 125 lb 3.2 oz (56.79 kg).  GENERAL:alert, no distress and comfortable SKIN: skin color, texture, turgor are normal, no rashes or significant lesions EYES: PERLA; Conjunctiva are pink and non-injected, sclera clear SINUSES: No redness or tenderness over maxillary or ethmoid sinuses OROPHARYNX:no exudate, no erythema on lips, buccal mucosa, or tongue. NECK: supple, thyroid normal size, non-tender, without nodularity. No masses CHEST: Normal AP diameter with no gynecomastia. LYMPH:  no palpable lymphadenopathy in the cervical, axillary or inguinal LUNGS: clear to auscultation and percussion with normal breathing effort HEART: regular rate & rhythm and no murmurs. ABDOMEN:abdomen soft, non-tender and normal bowel sounds. No masses felt. Liver and spleen not large. MUSCULOSKELETAL:no cyanosis of digits and no clubbing. Range of motion normal.  NEURO: alert & oriented x 3 with fluent speech, no focal motor/sensory deficits. No evidence of asterixis.   LABORATORY DATA: Infusion on 06/26/2013  Component Date Value Ref Range Status  . WBC 06/26/2013 13.0* 4.0 - 10.5 K/uL Final  . RBC 06/26/2013 3.79* 4.22 - 5.81 MIL/uL Final  . Hemoglobin 06/26/2013 10.7* 13.0 - 17.0 g/dL Final  . HCT 06/26/2013 32.3* 39.0 - 52.0 % Final  . MCV 06/26/2013 85.2  78.0 - 100.0 fL Final  . MCH 06/26/2013 28.2  26.0 - 34.0 pg Final  . MCHC 06/26/2013 33.1  30.0 - 36.0 g/dL Final  . RDW 06/26/2013 17.1* 11.5 - 15.5 % Final  . Platelets 06/26/2013 313  150 - 400 K/uL Final  . Neutrophils Relative % 06/26/2013 88* 43 - 77 % Final  . Neutro Abs 06/26/2013 11.4* 1.7 - 7.7 K/uL Final  . Lymphocytes Relative 06/26/2013 8* 12 - 46 % Final  . Lymphs Abs 06/26/2013 1.0  0.7 - 4.0 K/uL Final  . Monocytes Relative 06/26/2013 3  3 - 12 % Final  . Monocytes Absolute 06/26/2013 0.4  0.1 - 1.0 K/uL Final  . Eosinophils Relative 06/26/2013 1  0 - 5 % Final  . Eosinophils Absolute 06/26/2013 0.1  0.0 - 0.7 K/uL Final    . Basophils Relative 06/26/2013 0  0 - 1 % Final  . Basophils Absolute 06/26/2013 0.0  0.0 - 0.1 K/uL Final    PATHOLOGY: Patient refused biopsy.  Urinalysis    Component Value Date/Time   COLORURINE RED* 04/06/2013 1442   APPEARANCEUR TURBID* 04/06/2013 1442   LABSPEC 1.020 04/06/2013 1442   PHURINE 6.5 04/06/2013 1442   GLUCOSEU 100* 04/06/2013 1442   HGBUR LARGE* 04/06/2013 1442   BILIRUBINUR SMALL* 04/06/2013 1442   KETONESUR NEGATIVE 04/06/2013 1442   PROTEINUR >300* 04/06/2013 1442   UROBILINOGEN 1.0 04/06/2013 1442   NITRITE POSITIVE* 04/06/2013 1442   LEUKOCYTESUR TRACE* 04/06/2013 1442    RADIOGRAPHIC STUDIES: No results found.  ASSESSMENT:  #1. 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 #2. Benign prostatic hypertrophy, status post TURP.  #3. Intermittent nausea with weight loss.  #4. Gastroesophageal reflux disease, on pantoprazole along with pancreatic enzymes.     PLAN:  #1. Continue current pain regimen. Patient was told to call should he develop back pain while lying flats of that he could institute the use of more potent analgesics. He is aware of this prognosis and was given an estimated time of 3 months. 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 His son and wife are present and are providing tremendous support. #2. Followup with urology. #3. No further appointments were made in this office.   All questions were answered. The patient knows to call the clinic with any problems, questions or concerns. We can certainly see the patient much sooner if necessary.   I spent 25 minutes counseling the patient face to face. The total time spent in the appointment was 30 minutes.    Doroteo Bradford, MD 06/26/2013 11:34 AM

## 2013-06-26 NOTE — Progress Notes (Signed)
Labs drawn today for cbc/diff,cmp 

## 2013-06-26 NOTE — Patient Instructions (Signed)
[facility] Discharge Instructions  RECOMMENDATIONS MADE BY THE CONSULTANT AND ANY TEST RESULTS WILL BE SENT TO YOUR REFERRING PHYSICIAN.  EXAM FINDINGS BY THE PHYSICIAN TODAY AND SIGNS OR SYMPTOMS TO REPORT TO CLINIC OR PRIMARY PHYSICIAN: Exam and findings as discussed by Dr.Formanek.  MEDICATIONS PRESCRIBED:  Continue all medications as prescribed.  INSTRUCTIONS/FOLLOW-UP: No follow up appointment needed. Call us if there is anything we can help with or if you have questions or concerns.  Thank you for choosing [facility] to provide your oncology and hematology care.  To afford each patient quality time with our providers, please arrive at least 15 minutes before your scheduled appointment time.  With your help, our goal is to use those 15 minutes to complete the necessary work-up to ensure our physicians have the information they need to help with your evaluation and healthcare recommendations.    Effective January 1st, 2014, we ask that you re-schedule your appointment with our physicians should you arrive 10 or more minutes late for your appointment.  We strive to give you quality time with our providers, and arriving late affects you and other patients whose appointments are after yours.    Again, thank you for choosing [facility].  Our hope is that these requests will decrease the amount of time that you wait before being seen by our physicians.       _____________________________________________________________  Should you have questions after your visit to [facility], please contact our office at [phone] between the hours of 8:30 a.m. and 5:00 p.m.  Voicemails left after 4:30 p.m. will not be returned until the following business day.  For prescription refill requests, have your pharmacy contact our office with your prescription refill request.

## 2013-06-29 ENCOUNTER — Telehealth (HOSPITAL_COMMUNITY): Payer: Self-pay | Admitting: *Deleted

## 2013-06-29 NOTE — Telephone Encounter (Signed)
Received a call from Frances Nickels at [facility]. Reports pt is in need of a refill of Creon. Liliane Channel said this is a very expensive medication, reports patient told him he only has 3 months to live. Rick's question is does this medication need to be refilled/continued, or can it be discontinued? Rick's [phone]

## 2013-07-12 ENCOUNTER — Encounter (HOSPITAL_COMMUNITY): Payer: Self-pay | Admitting: Pharmacy Technician

## 2013-07-18 ENCOUNTER — Encounter (HOSPITAL_COMMUNITY)
Admission: RE | Admit: 2013-07-18 | Discharge: 2013-07-18 | Disposition: A | Discharge status: Home / Self Care | Payer: Medicare Other | Source: Ambulatory Visit | Attending: Ophthalmology | Admitting: Ophthalmology

## 2013-07-18 ENCOUNTER — Encounter (HOSPITAL_COMMUNITY): Payer: Self-pay

## 2013-07-20 MED ORDER — LIDOCAINE HCL 3.5 % OP GEL
OPHTHALMIC | Status: AC
  Filled 2013-07-20: qty 1

## 2013-07-20 MED ORDER — TETRACAINE HCL 0.5 % OP SOLN
OPHTHALMIC | Status: AC
  Filled 2013-07-20: qty 2

## 2013-07-20 MED ORDER — CYCLOPENTOLATE-PHENYLEPHRINE OP SOLN OPTIME - NO CHARGE
OPHTHALMIC | Status: AC
  Filled 2013-07-20: qty 2

## 2013-07-23 ENCOUNTER — Ambulatory Visit (HOSPITAL_COMMUNITY)
Admission: RE | Admit: 2013-07-23 | Discharge: 2013-07-23 | Disposition: A | Discharge status: Home / Self Care | Payer: Medicare Other | Source: Ambulatory Visit | Attending: Ophthalmology | Admitting: Ophthalmology

## 2013-07-23 ENCOUNTER — Encounter (HOSPITAL_COMMUNITY): Payer: Self-pay | Admitting: *Deleted

## 2013-07-23 ENCOUNTER — Encounter (HOSPITAL_COMMUNITY)
Admission: RE | Disposition: A | Discharge status: Home / Self Care | Payer: Self-pay | Source: Ambulatory Visit | Attending: Ophthalmology

## 2013-07-23 ENCOUNTER — Encounter (HOSPITAL_COMMUNITY): Payer: Medicare Other | Admitting: Anesthesiology

## 2013-07-23 ENCOUNTER — Ambulatory Visit (HOSPITAL_COMMUNITY): Payer: Medicare Other | Admitting: Anesthesiology

## 2013-07-23 DIAGNOSIS — H2510 Age-related nuclear cataract, unspecified eye: Secondary | ICD-10-CM | POA: Insufficient documentation

## 2013-07-23 HISTORY — PX: CATARACT EXTRACTION W/PHACO: SHX586

## 2013-07-23 LAB — GLUCOSE, CAPILLARY: GLUCOSE-CAPILLARY: 248 mg/dL — AB (ref 70–99)

## 2013-07-23 SURGERY — PHACOEMULSIFICATION, CATARACT, WITH IOL INSERTION
Anesthesia: Monitor Anesthesia Care | Site: Eye | Laterality: Right

## 2013-07-23 MED ORDER — CYCLOPENTOLATE-PHENYLEPHRINE 0.2-1 % OP SOLN
1.0000 [drp] | OPHTHALMIC | Status: AC
  Administered 2013-07-23 (×3): 1 [drp] via OPHTHALMIC

## 2013-07-23 MED ORDER — LACTATED RINGERS IV SOLN
INTRAVENOUS | Status: DC
  Administered 2013-07-23: 1000 mL via INTRAVENOUS

## 2013-07-23 MED ORDER — TETRACAINE HCL 0.5 % OP SOLN
1.0000 [drp] | OPHTHALMIC | Status: AC
  Administered 2013-07-23 (×3): 1 [drp] via OPHTHALMIC

## 2013-07-23 MED ORDER — MIDAZOLAM HCL 2 MG/2ML IJ SOLN
1.0000 mg | INTRAMUSCULAR | Status: DC | PRN
  Administered 2013-07-23: 1 mg via INTRAVENOUS
  Filled 2013-07-23: qty 2

## 2013-07-23 MED ORDER — LIDOCAINE HCL 3.5 % OP GEL
OPHTHALMIC | Status: DC | PRN
  Administered 2013-07-23: 1 via OPHTHALMIC

## 2013-07-23 MED ORDER — FENTANYL CITRATE 0.05 MG/ML IJ SOLN
25.0000 ug | INTRAMUSCULAR | Status: AC
  Administered 2013-07-23 (×2): 25 ug via INTRAVENOUS

## 2013-07-23 MED ORDER — FENTANYL CITRATE 0.05 MG/ML IJ SOLN
INTRAMUSCULAR | Status: AC
  Filled 2013-07-23: qty 2

## 2013-07-23 MED ORDER — EPINEPHRINE HCL 1 MG/ML IJ SOLN
INTRAOCULAR | Status: DC | PRN
  Administered 2013-07-23: 10:00:00

## 2013-07-23 MED ORDER — NA HYALUR & NA CHOND-NA HYALUR 0.55-0.5 ML IO KIT
PACK | INTRAOCULAR | Status: DC | PRN
  Administered 2013-07-23: 1 via OPHTHALMIC

## 2013-07-23 MED ORDER — BSS IO SOLN
INTRAOCULAR | Status: DC | PRN
  Administered 2013-07-23: 15 mL via INTRAOCULAR

## 2013-07-23 MED ORDER — TETRACAINE 0.5 % OP SOLN OPTIME - NO CHARGE
OPHTHALMIC | Status: DC | PRN
  Administered 2013-07-23: 1 [drp] via OPHTHALMIC

## 2013-07-23 MED ORDER — POVIDONE-IODINE 5 % OP SOLN
OPHTHALMIC | Status: DC | PRN
  Administered 2013-07-23: 1 via OPHTHALMIC

## 2013-07-23 SURGICAL SUPPLY — 28 items
CAPSULAR TENSION RING-AMO (OPHTHALMIC RELATED) ×3 IMPLANT
CLOTH BEACON ORANGE TIMEOUT ST (SAFETY) ×3 IMPLANT
GLOVE BIO SURGEON STRL SZ7.5 (GLOVE) IMPLANT
GLOVE BIOGEL M 6.5 STRL (GLOVE) IMPLANT
GLOVE BIOGEL PI IND STRL 6.5 (GLOVE) IMPLANT
GLOVE BIOGEL PI IND STRL 7.0 (GLOVE) ×2 IMPLANT
GLOVE BIOGEL PI INDICATOR 6.5 (GLOVE)
GLOVE BIOGEL PI INDICATOR 7.0 (GLOVE) ×4
GLOVE ECLIPSE 6.5 STRL STRAW (GLOVE) IMPLANT
GLOVE ECLIPSE 7.5 STRL STRAW (GLOVE) IMPLANT
GLOVE EXAM NITRILE LRG STRL (GLOVE) IMPLANT
GLOVE EXAM NITRILE MD LF STRL (GLOVE) IMPLANT
GLOVE SKINSENSE NS SZ6.5 (GLOVE)
GLOVE SKINSENSE NS SZ7.0 (GLOVE)
GLOVE SKINSENSE STRL SZ6.5 (GLOVE) IMPLANT
GLOVE SKINSENSE STRL SZ7.0 (GLOVE) IMPLANT
INST SET CATARACT ~~LOC~~ (KITS) ×3 IMPLANT
KIT VITRECTOMY (OPHTHALMIC RELATED) IMPLANT
PAD ARMBOARD 7.5X6 YLW CONV (MISCELLANEOUS) ×3 IMPLANT
PROC W NO LENS (INTRAOCULAR LENS)
PROC W SPEC LENS (INTRAOCULAR LENS)
PROCESS W NO LENS (INTRAOCULAR LENS) IMPLANT
PROCESS W SPEC LENS (INTRAOCULAR LENS) IMPLANT
RING MALYGIN (MISCELLANEOUS) IMPLANT
SIGHTPATH CAT PROC W REG LENS (Ophthalmic Related) ×3 IMPLANT
VISCOELASTIC ADDITIONAL (OPHTHALMIC RELATED) IMPLANT
WATER STERILE IRR 250ML POUR (IV SOLUTION) ×3 IMPLANT
capsular tension ring-Stabil Eyes ×3 IMPLANT

## 2013-07-23 NOTE — H&P (Signed)
I have reviewed the pre printed H&P, the patient was re-examined, and I have identified no significant interval changes in the patient's medical condition.  There is no change in the plan of care since the history and physical of record. 

## 2013-07-23 NOTE — Transfer of Care (Signed)
Immediate Anesthesia Transfer of Care Note  Patient: Jeremy Ray  Procedure(s) Performed: Procedure(s) with comments: CATARACT EXTRACTION PHACO AND INTRAOCULAR LENS PLACEMENT (IOC) (Right) - CDE:23.89  Patient Location: Short Stay  Anesthesia Type:MAC  Level of Consciousness: awake  Airway & Oxygen Therapy: Patient Spontanous Breathing  Post-op Assessment: Report given to PACU RN  Post vital signs: Reviewed  Complications: No apparent anesthesia complications

## 2013-07-23 NOTE — Discharge Instructions (Signed)
Jeremy Ray 07/23/2013 Dr. Iona Hansen Post operative Instructions for Cataract Patients  These instructions are for Jeremy Ray and pertain to the operative eye.  1.  Resume your normal diet and previous oral medicines.  2. Your Follow-up appointment is at Dr. Iona Hansen' office in [place] on 07/25/13 at 9:45  3. You may leave the hospital when your driver is present and your nurse releases you.  4. Begin Pred Forte (prednisolone acetate 1%), Acular LS (ketorolac tromethamine .4%) and Gatifloxacin 0.5% eye drops; 1 drop each 4 times daily to operative eye. Begin 3 hours after discharge from Short Stay Unit.  Moxifloxacin 0.5% may be substituted for Gatifloxacin using the same instructions.  59. Page Dr. Iona Hansen via beeper [phone] for significant pain in or around operative eye that is not relieved by Tylenol.  6. If you took Plavix before surgery, restart it at the usual dose on the evening of surgery.  7. Wear dark glasses as necessary for excessive light sensitivity.  8. Do no forcefully rub you your operative eye.  9. Keep your operative eye dry for 1 week. You may gently clean your eyelids with a damp washcloth.  10. You may resume normal occupational activities in one week and resume driving as tolerated after the first post operative visit.  11. It is normal to have blurred vision and a scratchy sensation following surgery.  Dr. Iona Hansen: [phone]

## 2013-07-23 NOTE — Brief Op Note (Signed)
07/23/2013  12:21 PM  PATIENT:  Daphene Jaeger  80 y.o. male  PRE-OPERATIVE DIAGNOSIS:  nuclear cataract right eye  POST-OPERATIVE DIAGNOSIS:  nuclear cataract right eye  PROCEDURE:  Procedure(s): CATARACT EXTRACTION PHACO AND INTRAOCULAR LENS PLACEMENT (IOC)/Capsular tension ring implantation  SURGEON:  Surgeon(s): Williams Che, MD  ASSISTANTS: Suzette Battiest, CST   ANESTHESIA STAFF: Anesthesiologist: Lerry Liner, MD CRNA: Ollen Bowl, CRNA  ANESTHESIA:   topical and MAC  REQUESTED LENS POWER: 20.5  LENS IMPLANT INFORMATION:   Alcon [id number]   S/n [id number]  Exp11/2018   CTR  AMO [id number] 13.0 mm  S/n [id number]  CUMULATIVE DISSIPATED ENERGY:23.89  INDICATIONS:  See office H&P  OP FINDINGS:brunescent cataract with cortical sclerosis and pseudoexfoliation of the lens capsule  COMPLICATIONS: zonulardehiscence requiring CTR implantation and anterior Cap Tear without vitreous presentation  DICTATION #:  See scanned op note  PLAN OF CARE: as above  PATIENT DISPOSITION:  Short Stay

## 2013-07-23 NOTE — Anesthesia Preprocedure Evaluation (Signed)
Anesthesia Evaluation  Patient identified by MRN, date of birth, ID band Patient awake    Reviewed: Allergy & Precautions, H&P , NPO status , Patient's Chart, lab work & pertinent test results  Airway Mallampati: I TM Distance: >3 FB     Dental  (+) Edentulous Upper, Edentulous Lower   Pulmonary former smoker,  breath sounds clear to auscultation        Cardiovascular + dysrhythmias Rhythm:Regular Rate:Normal     Neuro/Psych    GI/Hepatic GERD-  Controlled and Medicated,Pancreatic cancer, hx obstructive jaundice   Endo/Other  diabetes (borderline), Type 2  Renal/GU      Musculoskeletal   Abdominal   Peds  Hematology  (+) anemia ,   Anesthesia Other Findings   Reproductive/Obstetrics                           Anesthesia Physical Anesthesia Plan  ASA: III  Anesthesia Plan: MAC   Post-op Pain Management:    Induction: Intravenous  Airway Management Planned: Nasal Cannula  Additional Equipment:   Intra-op Plan:   Post-operative Plan:   Informed Consent: I have reviewed the patients History and Physical, chart, labs and discussed the procedure including the risks, benefits and alternatives for the proposed anesthesia with the patient or authorized representative who has indicated his/her understanding and acceptance.     Plan Discussed with:   Anesthesia Plan Comments:         Anesthesia Quick Evaluation

## 2013-07-23 NOTE — Anesthesia Postprocedure Evaluation (Signed)
  Anesthesia Post-op Note  Patient: Jeremy Ray  Procedure(s) Performed: Procedure(s) with comments: CATARACT EXTRACTION PHACO AND INTRAOCULAR LENS PLACEMENT (IOC) (Right) - CDE:23.89  Patient Location: Nursing Unit  Anesthesia Type:MAC  Level of Consciousness: awake, alert  and oriented  Airway and Oxygen Therapy: Patient Spontanous Breathing  Post-op Pain: none  Post-op Assessment: Post-op Vital signs reviewed, Patient's Cardiovascular Status Stable, Respiratory Function Stable, Patent Airway and No signs of Nausea or vomiting  Post-op Vital Signs: Reviewed and stable  Last Vitals:  Filed Vitals:   07/23/13 0935  BP: 86/56  Temp:   Resp: 11    Complications: No apparent anesthesia complications

## 2013-07-23 NOTE — Op Note (Signed)
See scanned op note 

## 2013-07-25 ENCOUNTER — Encounter (HOSPITAL_COMMUNITY): Payer: Self-pay | Admitting: Ophthalmology

## 2013-08-20 DEATH — deceased

## 2013-08-27 ENCOUNTER — Ambulatory Visit (INDEPENDENT_AMBULATORY_CARE_PROVIDER_SITE_OTHER): Payer: Medicare Other | Admitting: Internal Medicine

## 2013-08-27 ENCOUNTER — Telehealth (INDEPENDENT_AMBULATORY_CARE_PROVIDER_SITE_OTHER): Payer: Self-pay | Admitting: *Deleted

## 2013-08-27 NOTE — Telephone Encounter (Signed)
08/24/2013 left message on machine   Patient's wife called and said he had passed away and of course would not need his appt. On Monday afternoon

## 2014-07-19 IMAGING — CT CT ABDOMEN W/ CM
2 of 4 series · 15 of 46 positions shown, 17 images · IV contrast (omnipaque)
Comparison: None

CLINICAL DATA: Jaundice, weight loss

EXAM:
CT ABDOMEN WITH CONTRAST
TECHNIQUE: Multidetector CT imaging of the abdomen was performed using the
standard protocol following bolus administration of intravenous
contrast. Sagittal and coronal MPR images reconstructed from axial
data set.
CONTRAST:  100mL OMNIPAQUE IOHEXOL 300 MG/ML SOLN. Dilute oral
contrast.

[Series 2: abd_pel_with 5.0 b40f · axial · 0.65mm/px · z∈[-372,-177]mm · 12 of 47 slices shown, 14 images]
[im 4/47  soft-tissue]
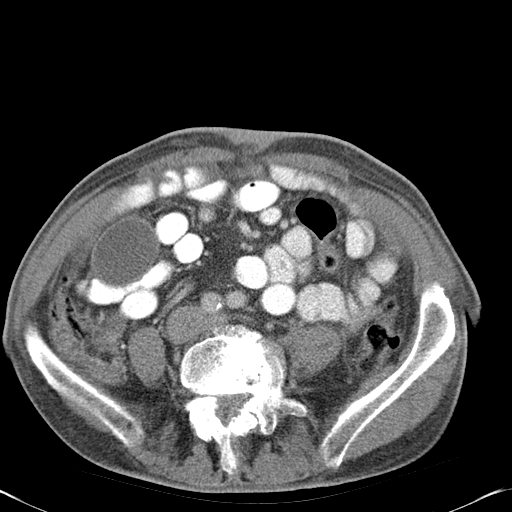
[im 4/47  bone]
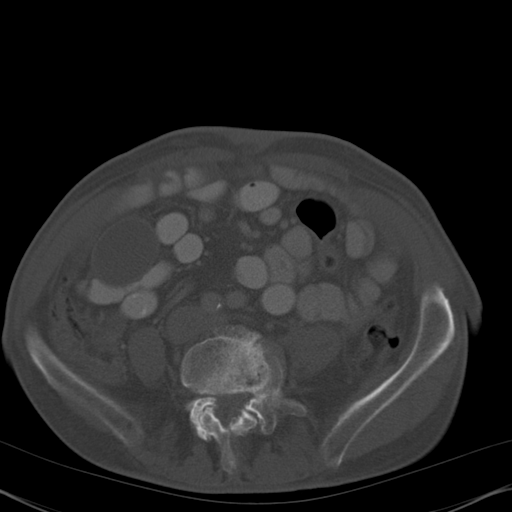
[im 7/47  soft-tissue]
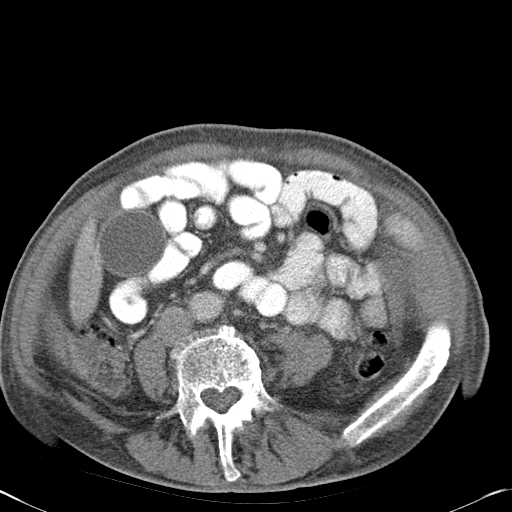
[im 10/47  soft-tissue]
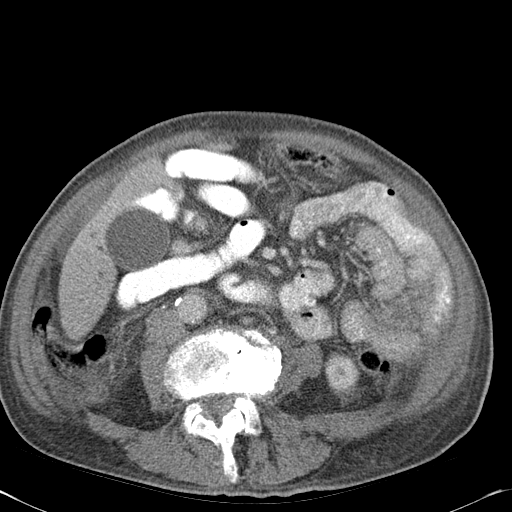
[im 16/47  soft-tissue]
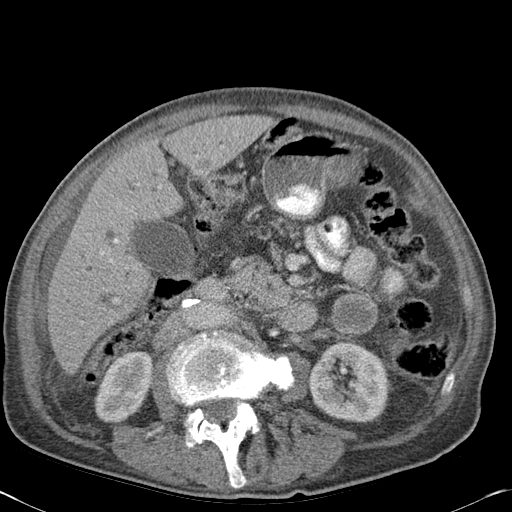
[im 19/47  soft-tissue]
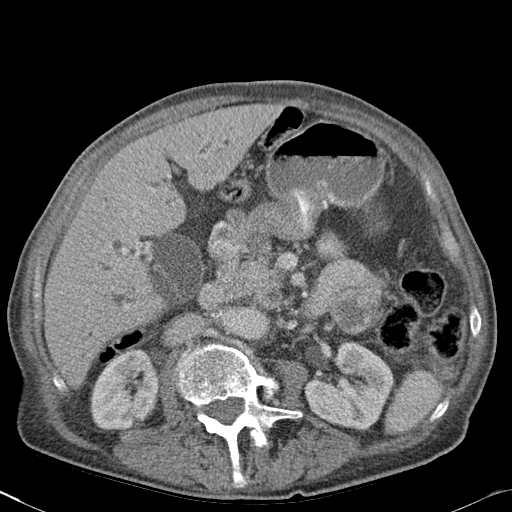
[im 22/47  soft-tissue]
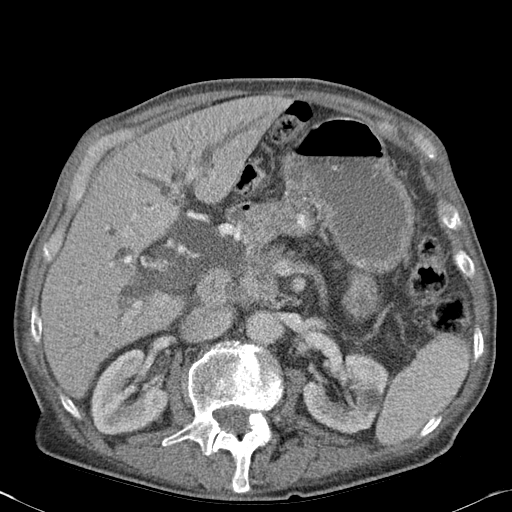
[im 25/47  soft-tissue]
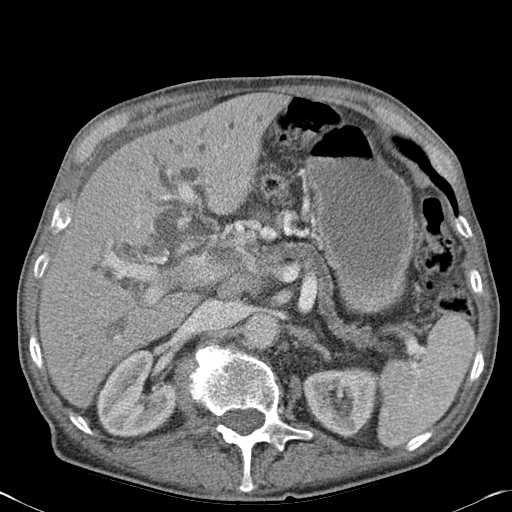
[im 28/47  soft-tissue]
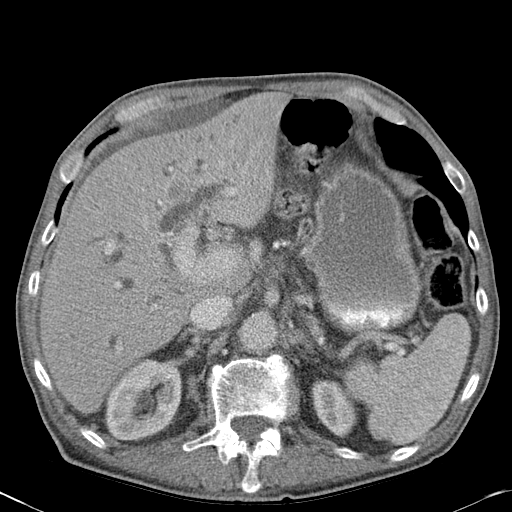
[im 31/47  soft-tissue]
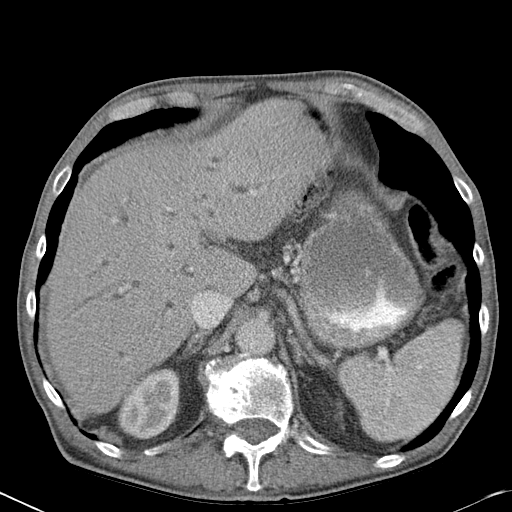
[im 31/47  bone]
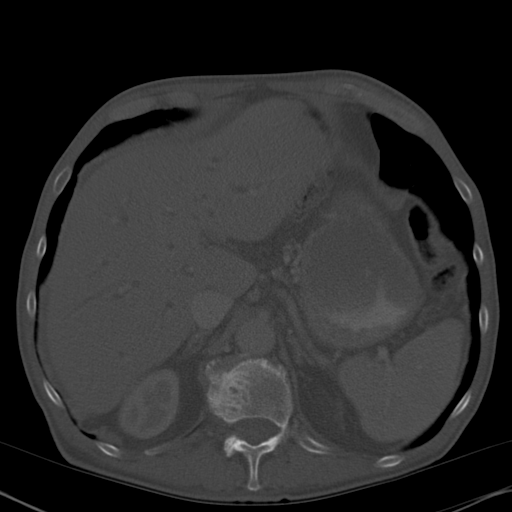
[im 37/47  soft-tissue]
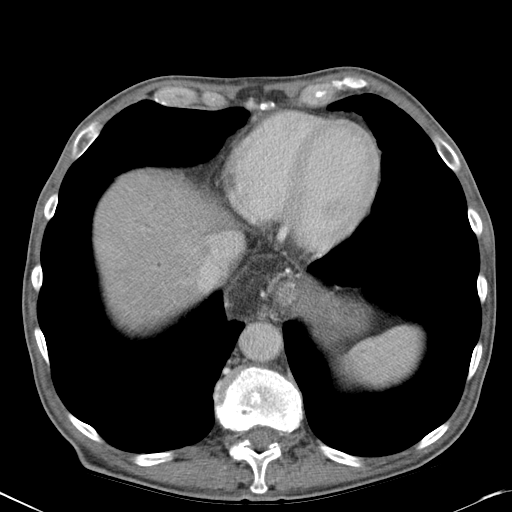
[im 40/47  soft-tissue]
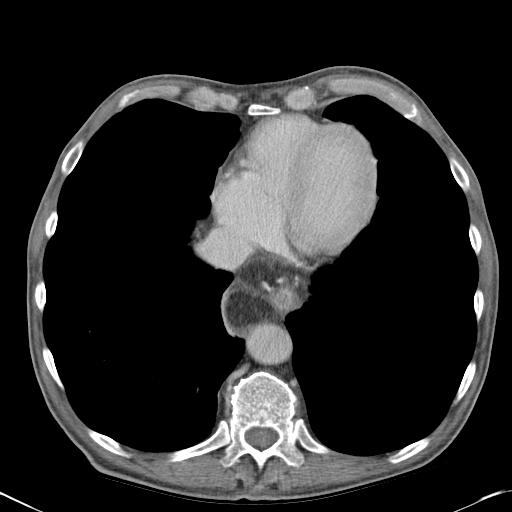
[im 43/47  soft-tissue]
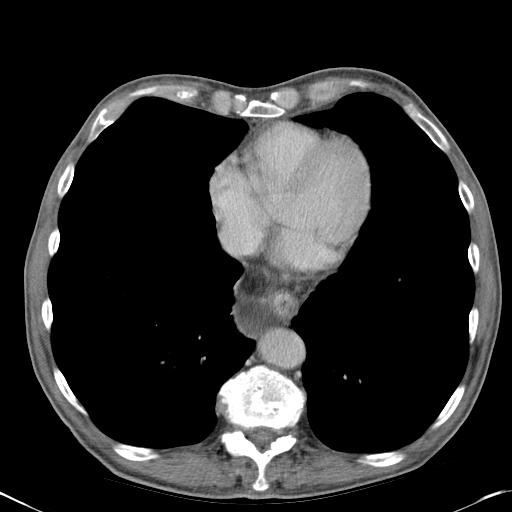

[Series 4: abd_pel_with 3.0 spo cor · coronal · 0.45mm/px · 3 of 88 slices shown]
[im 30/88  soft-tissue]
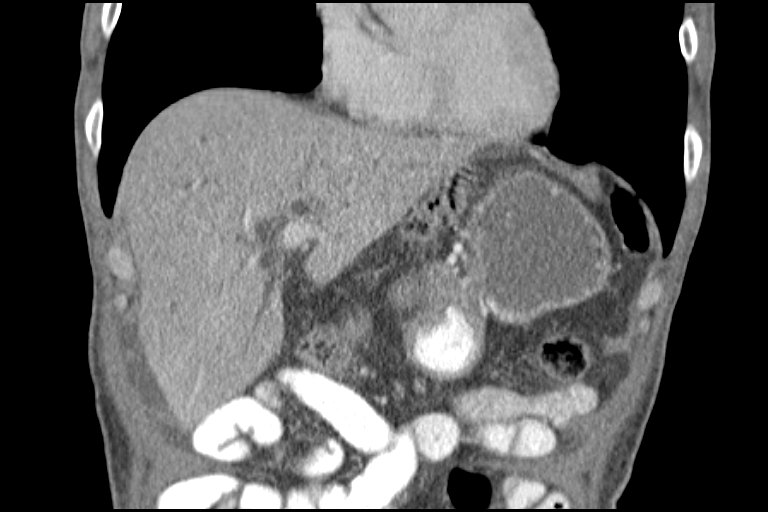
[im 39/88  soft-tissue]
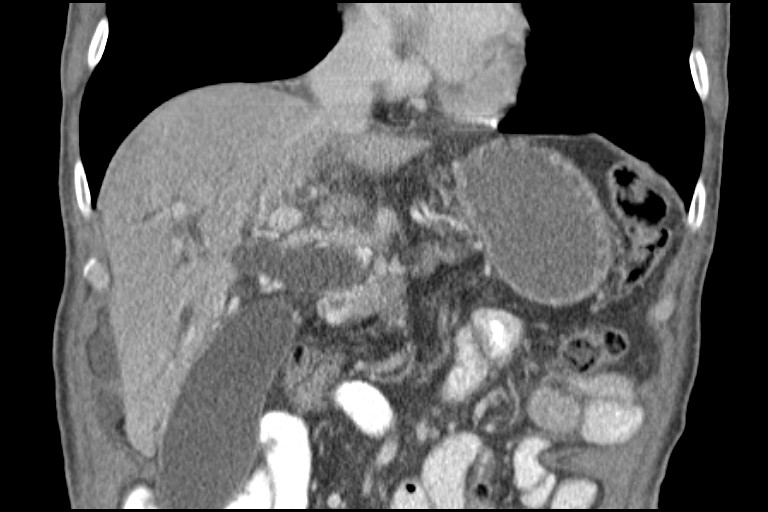
[im 49/88  soft-tissue]
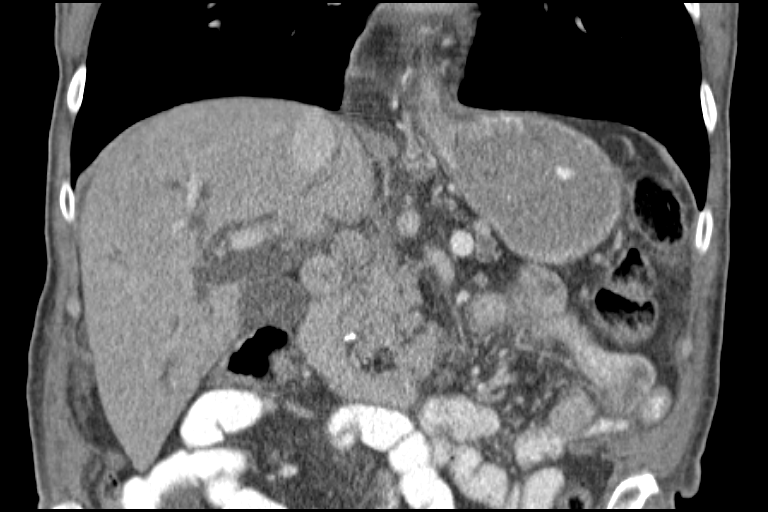

[15 of 46 positions shown; findings below may reference images not displayed]

FINDINGS: Minimal scarring at right lung base.

Paraesophageal herniation of a small amount of fat into inferior
mediastinum.

Esophageal and paraesophageal varices noted.

Distended gallbladder.

Significant intrahepatic and proximal extrahepatic biliary
dilatation due to high-grade distal CBD obstruction at the
pancreatic head

CBD measures up to 25 mm diameter.

Abnormal soft tissue identified at the distal CBD and pancreatic
head/body compatible with tumor of either pancreatic or biliary
origin measuring 2.2 x 1.8 x 2.1 cm.

Mildly enlarged portacaval lymph node 13 mm short axis.

Additional potential

Chronic portal vein occlusion with cavernous transformation of the
portal vein.

Distal portal vein and proximal intrahepatic portal radicles appear
patent.

Splenic vein and SMV patent.

Infiltration of tissue planes in the celiac axis beginning at the
celiac artery bifurcation.

Small amount of ascites adjacent to liver.

No definite discrete peritoneal implant is seen though the pelvis
was not imaged.

Mildly enlarged portacaval lymph node 13 mm short axis image 23.

Additional upper normal size peripancreatic nodes.

Indistinct tissue planes between tumor at pancreatic head and
duodenum.

Gastric antrum incompletely distended.

Small cyst within left kidney 13 mm greatest diameter image 25.

Atrophic tail of pancreas secondary to mass at head/body.

Kidneys, adrenal glands and spleen otherwise unremarkable.

Visualize large and small bowel loops normal appearance.

Scattered atherosclerotic calcification aorta.

No acute osseous findings; scattered degenerative disc disease
changes noted.
IMPRESSION: Marked biliary dilatation secondary to abnormal soft tissue at the
pancreatic head/body consistent with a tumor of either pancreatic or
biliary origin.

CBD 25 mm diameter.

Infiltration of celiac axis with enlarged portacaval lymph node and
ascites.

Chronic portal vein inclusion with cavernous transformation.

## 2014-07-21 IMAGING — CR DG ABD PORTABLE 1V
1 series · 1 of 1 positions shown · non-contrast
Comparison: None.

CLINICAL DATA: Status post CRC.

EXAM:
PORTABLE ABDOMEN - 1 VIEW

[supine ap]
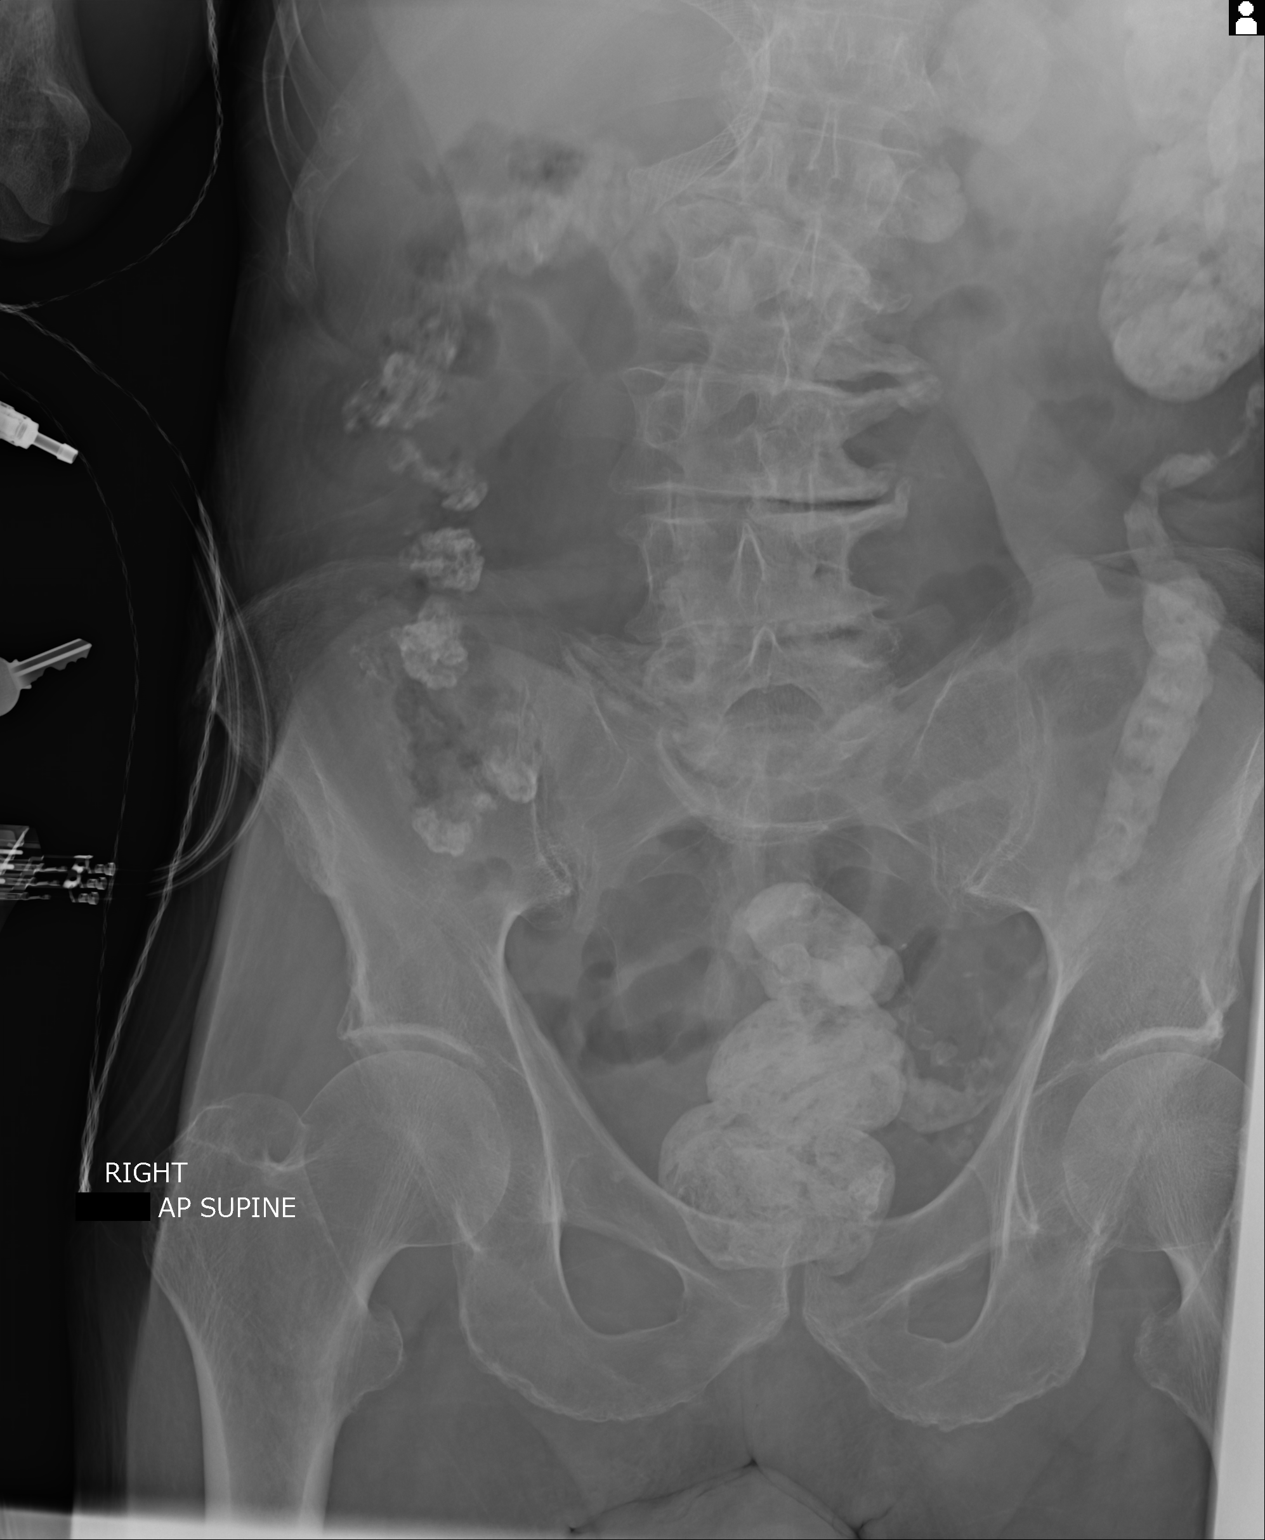

[1 of 1 positions shown; findings below may reference images not displayed]

FINDINGS: The upper abdomen is excluded from the field of view. There is a
partially visualized biliary stent present. There is contrast in the
colon from recent CT of the abdomen. There is no bowel dilatation to
suggest obstruction. There is no evidence of pneumoperitoneum,
portal venous gas or pneumatosis. There are no pathologic
calcifications along the expected course of the ureters.The osseous
structures are unremarkable.
IMPRESSION: Unremarkable KUB.  Biliary stent present.

## 2014-08-25 IMAGING — US US SCROTUM
1 series · 13 of 25 positions shown · non-contrast
Comparison: None.

CLINICAL DATA: Acute urinary retention, history of hydrocele as
well as benign prostatic hypertrophy and pancreatic malignancy

EXAM:
ULTRASOUND OF SCROTUM
TECHNIQUE: Complete ultrasound examination of the testicles, epididymis, and
other scrotal structures was performed.

[Series 1: us scrotum · 0.10mm/px · 69 acquisitions, 13 frames shown]
[im 1/69]
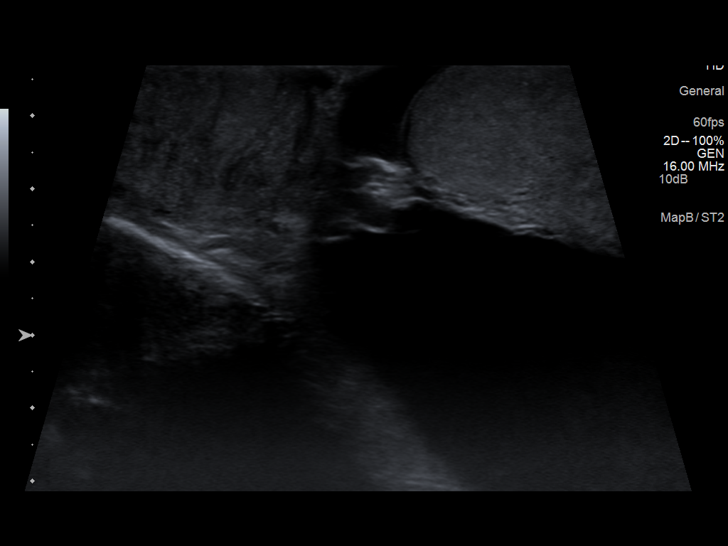
[im 6/69]
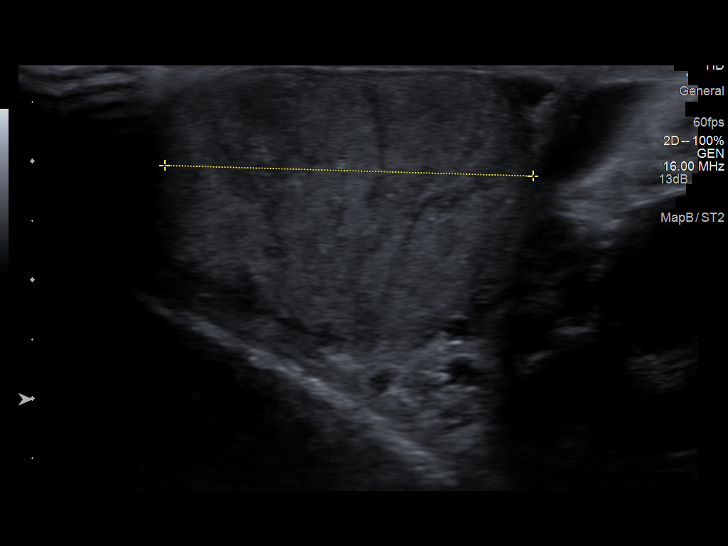
[im 12/69]
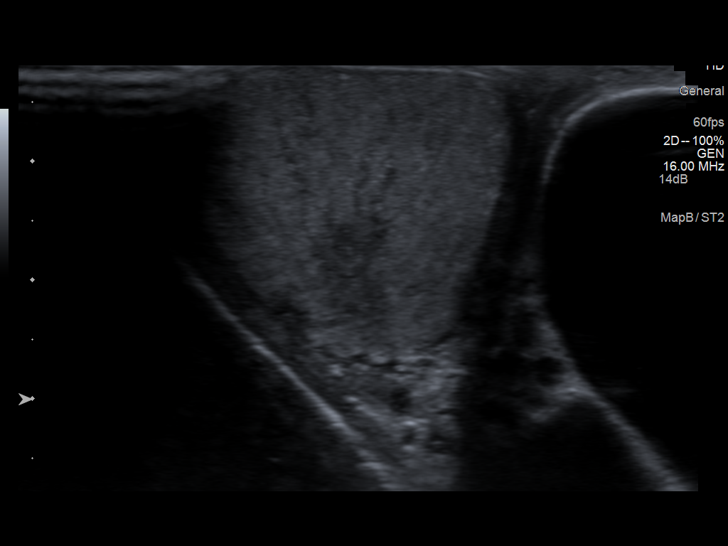
[im 18/69]
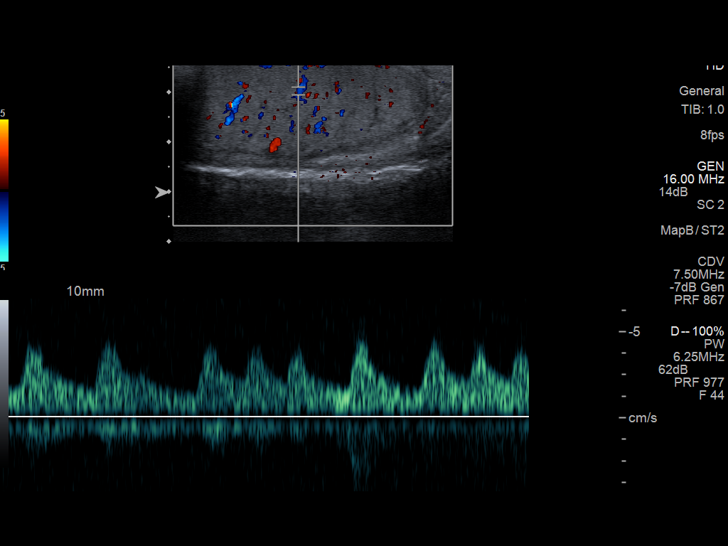
[im 23/69]
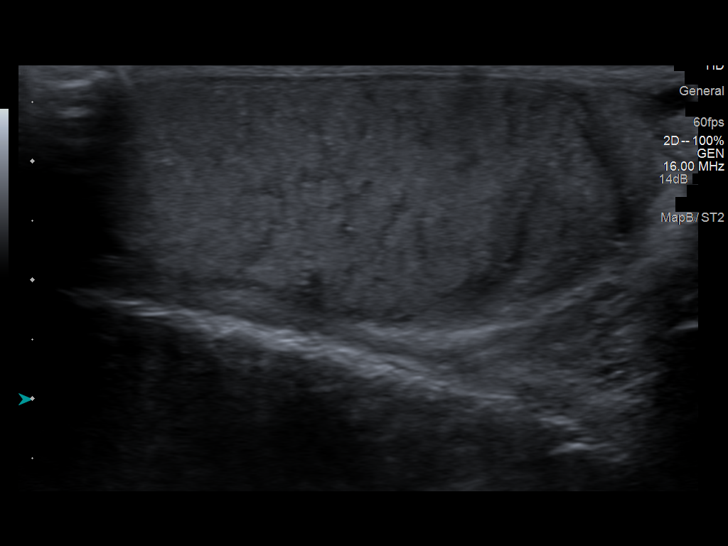
[im 29/69]
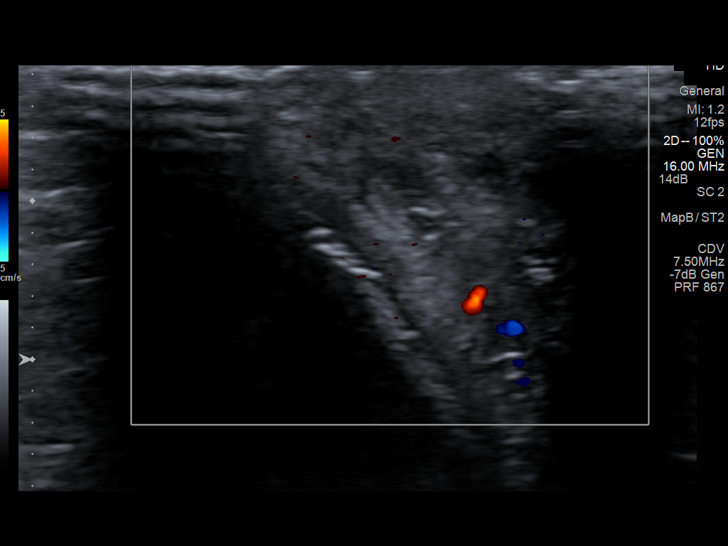
[im 35/69]
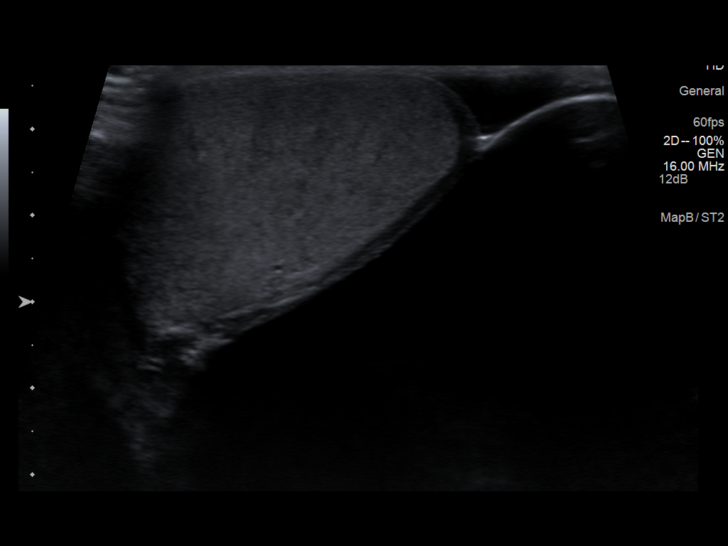
[im 40/69]
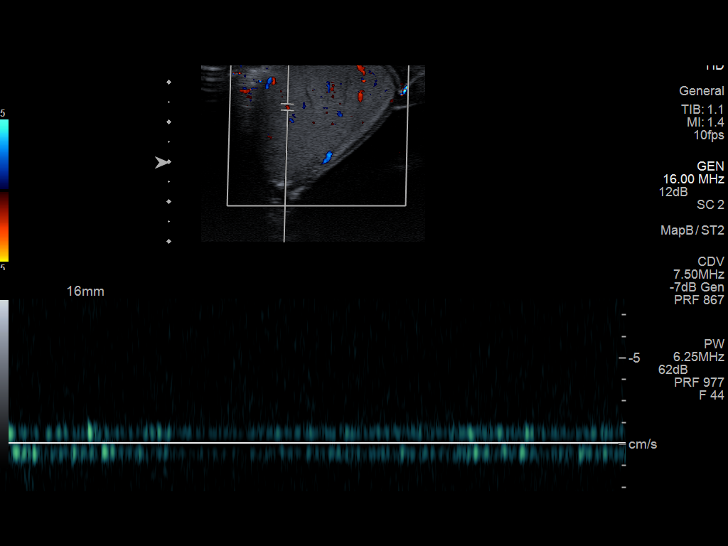
[im 46/69]
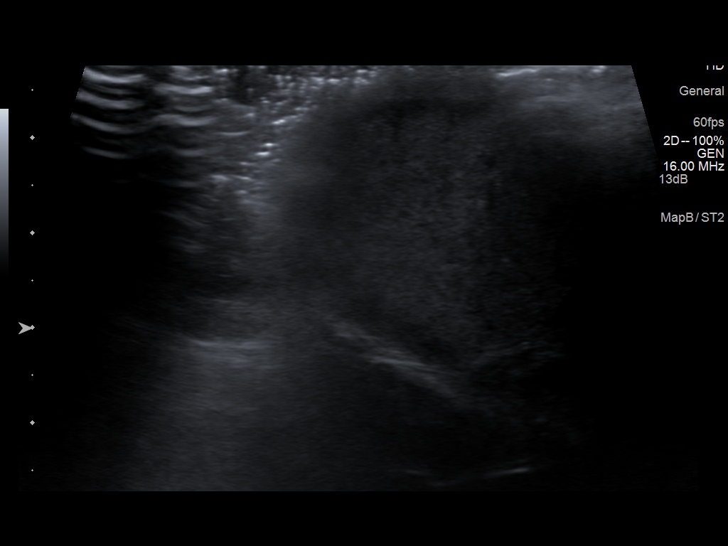
[im 52/69]
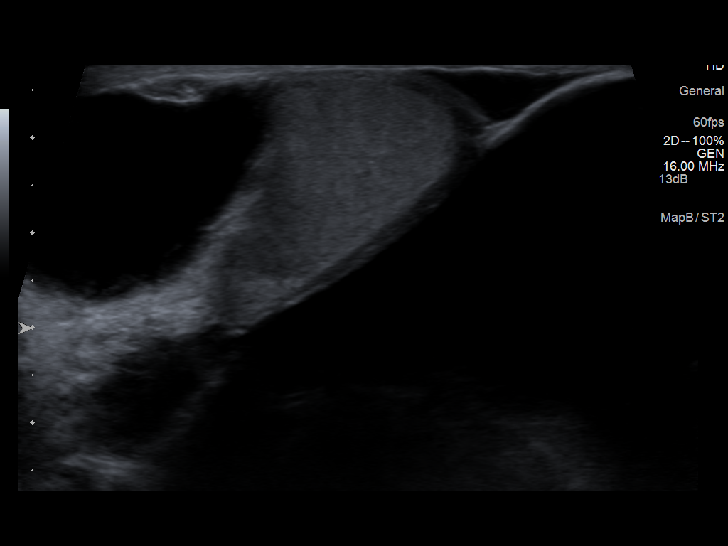
[im 57/69]
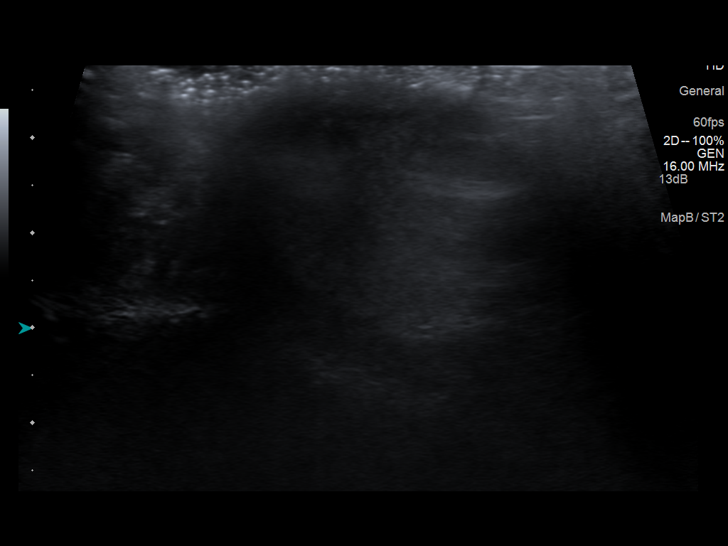
[im 63/69]
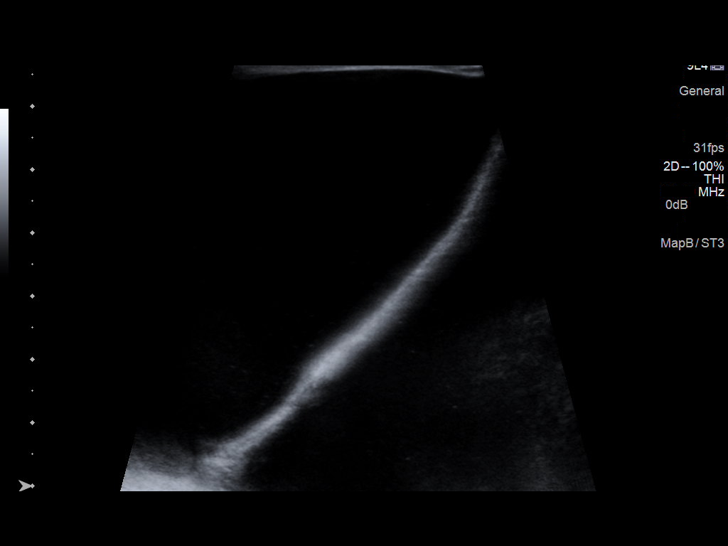
[im 69/69]
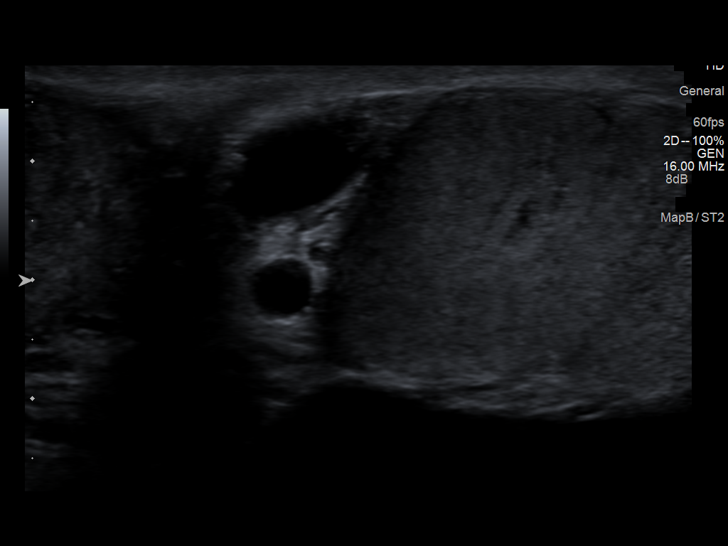

[13 of 25 positions shown; findings below may reference images not displayed]

FINDINGS: Right testicle

Measurements: 5.1 x 2.1 x 3.1 the echotexture of the right testicle
is somewhat heterogeneous. No discrete mass is demonstrated.
Vascularity of the right testicle is normal.. No microlithiasis is
demonstrated.

Left testicle

Measurements: 4.2 x 2.9 x 3.3 cm.. There is mass effect upon the
left testicle due to the large left-sided hydrocele. No mass or
microlithiasis visualized.

Right epididymis:  Not well demonstrated.

Left epididymis:  Not well demonstrated.  There may be of

Hydrocele: There is a large left-sided hydrocele containing
septations. It measures 10.3 x 8.6 x 6.8 cm.

Varicocele:  None visualized.
IMPRESSION: 1. There is no evidence of an intratesticular mass or testicular
torsion.
2. There is a tiny epididymal cyst on the left. The epididymis on
the right is not well evaluated.
3. There is a large, left-sided, septated hydrocele measuring at
least 10.3 cm in greatest dimension. No varicocele is demonstrated.
